# Patient Record
Sex: Male | Born: 1940 | Race: White | Hispanic: No | Marital: Married
Health system: Southern US, Community
[De-identification: ages and names within clinical notes are randomized; demographics above are authoritative.]

---

## 2021-01-18 ENCOUNTER — Inpatient Hospital Stay
Admission: AD | Admit: 2021-01-18 | Discharge: 2021-02-09 | Disposition: A | Discharge status: Still In Hospital | Payer: No Typology Code available for payment source | Source: Other Acute Inpatient Hospital | Attending: Internal Medicine | Admitting: Internal Medicine

## 2021-01-18 ENCOUNTER — Other Ambulatory Visit (HOSPITAL_COMMUNITY): Payer: Self-pay

## 2021-01-18 DIAGNOSIS — Z9911 Dependence on respirator [ventilator] status: Secondary | ICD-10-CM

## 2021-01-18 DIAGNOSIS — J939 Pneumothorax, unspecified: Secondary | ICD-10-CM

## 2021-01-18 DIAGNOSIS — Z431 Encounter for attention to gastrostomy: Secondary | ICD-10-CM

## 2021-01-18 DIAGNOSIS — Z930 Tracheostomy status: Secondary | ICD-10-CM

## 2021-01-18 DIAGNOSIS — S12400A Unspecified displaced fracture of fifth cervical vertebra, initial encounter for closed fracture: Secondary | ICD-10-CM

## 2021-01-18 DIAGNOSIS — J9690 Respiratory failure, unspecified, unspecified whether with hypoxia or hypercapnia: Secondary | ICD-10-CM

## 2021-01-18 DIAGNOSIS — I63511 Cerebral infarction due to unspecified occlusion or stenosis of right middle cerebral artery: Secondary | ICD-10-CM

## 2021-01-18 DIAGNOSIS — J189 Pneumonia, unspecified organism: Secondary | ICD-10-CM

## 2021-01-18 DIAGNOSIS — J9621 Acute and chronic respiratory failure with hypoxia: Secondary | ICD-10-CM

## 2021-01-18 LAB — BLOOD GAS, ARTERIAL
Acid-Base Excess: 2.8 mmol/L — ABNORMAL HIGH (ref 0.0–2.0)
Bicarbonate: 26.2 mmol/L (ref 20.0–28.0)
FIO2: 45
O2 Saturation: 95.5 %
Patient temperature: 37
pCO2 arterial: 36.1 mmHg (ref 32.0–48.0)
pH, Arterial: 7.474 — ABNORMAL HIGH (ref 7.350–7.450)
pO2, Arterial: 73.1 mmHg — ABNORMAL LOW (ref 83.0–108.0)

## 2021-01-18 MED ORDER — DIATRIZOATE MEGLUMINE & SODIUM 66-10 % PO SOLN
ORAL | Status: AC
  Administered 2021-01-18: 30 mL via GASTROSTOMY
  Filled 2021-01-18: qty 30

## 2021-01-19 DIAGNOSIS — S12400A Unspecified displaced fracture of fifth cervical vertebra, initial encounter for closed fracture: Secondary | ICD-10-CM

## 2021-01-19 DIAGNOSIS — J189 Pneumonia, unspecified organism: Secondary | ICD-10-CM | POA: Diagnosis not present

## 2021-01-19 DIAGNOSIS — S12401S Unspecified nondisplaced fracture of fifth cervical vertebra, sequela: Secondary | ICD-10-CM | POA: Diagnosis not present

## 2021-01-19 DIAGNOSIS — J9621 Acute and chronic respiratory failure with hypoxia: Secondary | ICD-10-CM

## 2021-01-19 DIAGNOSIS — J939 Pneumothorax, unspecified: Secondary | ICD-10-CM

## 2021-01-19 DIAGNOSIS — I63511 Cerebral infarction due to unspecified occlusion or stenosis of right middle cerebral artery: Secondary | ICD-10-CM

## 2021-01-19 LAB — CBC WITH DIFFERENTIAL/PLATELET
Abs Immature Granulocytes: 0.48 10*3/uL — ABNORMAL HIGH (ref 0.00–0.07)
Basophils Absolute: 0 10*3/uL (ref 0.0–0.1)
Basophils Relative: 1 %
Eosinophils Absolute: 0.2 10*3/uL (ref 0.0–0.5)
Eosinophils Relative: 3 %
HCT: 24.9 % — ABNORMAL LOW (ref 39.0–52.0)
Hemoglobin: 8 g/dL — ABNORMAL LOW (ref 13.0–17.0)
Immature Granulocytes: 6 %
Lymphocytes Relative: 5 %
Lymphs Abs: 0.4 10*3/uL — ABNORMAL LOW (ref 0.7–4.0)
MCH: 30.1 pg (ref 26.0–34.0)
MCHC: 32.1 g/dL (ref 30.0–36.0)
MCV: 93.6 fL (ref 80.0–100.0)
Monocytes Absolute: 0.9 10*3/uL (ref 0.1–1.0)
Monocytes Relative: 11 %
Neutro Abs: 6.4 10*3/uL (ref 1.7–7.7)
Neutrophils Relative %: 74 %
Platelets: 411 10*3/uL — ABNORMAL HIGH (ref 150–400)
RBC: 2.66 MIL/uL — ABNORMAL LOW (ref 4.22–5.81)
RDW: 15.8 % — ABNORMAL HIGH (ref 11.5–15.5)
WBC: 8.4 10*3/uL (ref 4.0–10.5)
nRBC: 0 % (ref 0.0–0.2)

## 2021-01-19 LAB — COMPREHENSIVE METABOLIC PANEL
ALT: 52 U/L — ABNORMAL HIGH (ref 0–44)
AST: 44 U/L — ABNORMAL HIGH (ref 15–41)
Albumin: 1.7 g/dL — ABNORMAL LOW (ref 3.5–5.0)
Alkaline Phosphatase: 71 U/L (ref 38–126)
Anion gap: 7 (ref 5–15)
BUN: 27 mg/dL — ABNORMAL HIGH (ref 8–23)
CO2: 25 mmol/L (ref 22–32)
Calcium: 8.2 mg/dL — ABNORMAL LOW (ref 8.9–10.3)
Chloride: 101 mmol/L (ref 98–111)
Creatinine, Ser: 0.73 mg/dL (ref 0.61–1.24)
GFR, Estimated: >60 mL/min (ref >60)
Glucose, Bld: 116 mg/dL — ABNORMAL HIGH (ref 70–99)
Potassium: 4.4 mmol/L (ref 3.5–5.1)
Sodium: 133 mmol/L — ABNORMAL LOW (ref 135–145)
Total Bilirubin: 0.7 mg/dL (ref 0.3–1.2)
Total Protein: 5.4 g/dL — ABNORMAL LOW (ref 6.5–8.1)

## 2021-01-19 LAB — PHOSPHORUS: Phosphorus: 3.3 mg/dL (ref 2.5–4.6)

## 2021-01-19 LAB — PROTIME-INR
INR: 1.7 — ABNORMAL HIGH (ref 0.8–1.2)
Prothrombin Time: 19.9 seconds — ABNORMAL HIGH (ref 11.4–15.2)

## 2021-01-19 LAB — HEMOGLOBIN A1C
Hgb A1c MFr Bld: 5.9 % — ABNORMAL HIGH (ref 4.8–5.6)
Mean Plasma Glucose: 122.63 mg/dL

## 2021-01-19 LAB — TSH: TSH: 9.572 u[IU]/mL — ABNORMAL HIGH (ref 0.350–4.500)

## 2021-01-19 LAB — MAGNESIUM: Magnesium: 2.2 mg/dL (ref 1.7–2.4)
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
## 2021-01-20 DIAGNOSIS — J9621 Acute and chronic respiratory failure with hypoxia: Secondary | ICD-10-CM | POA: Diagnosis not present

## 2021-01-20 DIAGNOSIS — I63511 Cerebral infarction due to unspecified occlusion or stenosis of right middle cerebral artery: Secondary | ICD-10-CM | POA: Diagnosis not present

## 2021-01-20 DIAGNOSIS — S12401S Unspecified nondisplaced fracture of fifth cervical vertebra, sequela: Secondary | ICD-10-CM | POA: Diagnosis not present

## 2021-01-20 DIAGNOSIS — J189 Pneumonia, unspecified organism: Secondary | ICD-10-CM | POA: Diagnosis not present

## 2021-01-20 LAB — URINALYSIS, ROUTINE W REFLEX MICROSCOPIC
Bilirubin Urine: NEGATIVE
Glucose, UA: >=500 mg/dL — AB
Hgb urine dipstick: NEGATIVE
Ketones, ur: NEGATIVE mg/dL
Leukocytes,Ua: NEGATIVE
Nitrite: NEGATIVE
Protein, ur: 30 mg/dL — AB
Specific Gravity, Urine: 1.02 (ref 1.005–1.030)
pH: 5 (ref 5.0–8.0)

## 2021-01-20 LAB — CBC
HCT: 25 % — ABNORMAL LOW (ref 39.0–52.0)
Hemoglobin: 8.1 g/dL — ABNORMAL LOW (ref 13.0–17.0)
MCH: 30 pg (ref 26.0–34.0)
MCHC: 32.4 g/dL (ref 30.0–36.0)
MCV: 92.6 fL (ref 80.0–100.0)
Platelets: 463 10*3/uL — ABNORMAL HIGH (ref 150–400)
RBC: 2.7 MIL/uL — ABNORMAL LOW (ref 4.22–5.81)
RDW: 15.9 % — ABNORMAL HIGH (ref 11.5–15.5)
WBC: 12 10*3/uL — ABNORMAL HIGH (ref 4.0–10.5)
nRBC: 0 % (ref 0.0–0.2)

## 2021-01-20 LAB — BASIC METABOLIC PANEL
Anion gap: 6 (ref 5–15)
BUN: 30 mg/dL — ABNORMAL HIGH (ref 8–23)
CO2: 26 mmol/L (ref 22–32)
Calcium: 8.4 mg/dL — ABNORMAL LOW (ref 8.9–10.3)
Chloride: 103 mmol/L (ref 98–111)
Creatinine, Ser: 0.69 mg/dL (ref 0.61–1.24)
GFR, Estimated: >60 mL/min (ref >60)
Glucose, Bld: 218 mg/dL — ABNORMAL HIGH (ref 70–99)
Potassium: 4.2 mmol/L (ref 3.5–5.1)
Sodium: 135 mmol/L (ref 135–145)

## 2021-01-20 LAB — CULTURE, RESPIRATORY W GRAM STAIN

## 2021-01-20 LAB — T4, FREE: Free T4: 1.19 ng/dL — ABNORMAL HIGH (ref 0.61–1.12)

## 2021-01-20 LAB — MAGNESIUM: Magnesium: 2.1 mg/dL (ref 1.7–2.4)

## 2021-01-20 LAB — PHOSPHORUS: Phosphorus: 2.2 mg/dL — ABNORMAL LOW (ref 2.5–4.6)

## 2021-01-20 NOTE — Progress Notes (Signed)
Pulmonary Critical Care Medicine [facility] [place]   PULMONARY CRITICAL CARE SERVICE  PROGRESS NOTE     Rhyan Radler  [id number]:[id number]  DOB: [birth year]   DOA: 01/18/2021  Referring Physician: Carron Curie, MD  HPI: Jessie Schrieber is a 80 y.o. male being followed for ventilator/airway/oxygen weaning Acute on Chronic Respiratory Failure.  Patient is on pressure support has been on 35% FiO2 current pressure is 12/5  Medications: Reviewed on Rounds  Physical Exam:  Vitals: Temperature 96.7 pulse 101 respiratory rate is 17 blood pressure is 123/77 saturations 93%  Ventilator Settings on pressure support FiO2 35% pressure 12/5  General: Comfortable at this time Neck: supple Cardiovascular: no malignant arrhythmias Respiratory: Scattered rhonchi expansion is equal Skin: no rash seen on limited exam Musculoskeletal: No gross abnormality Psychiatric:unable to assess Neurologic:no involuntary movements         Lab Data:   Basic Metabolic Panel: Recent Labs  Lab 01/19/21 0358 01/20/21 0715  NA 133* 135  K 4.4 4.2  CL 101 103  CO2 25 26  GLUCOSE 116* 218*  BUN 27* 30*  CREATININE 0.73 0.69  CALCIUM 8.2* 8.4*  MG 2.2 2.1  PHOS 3.3 2.2*    ABG: Recent Labs  Lab 01/18/21 1350  PHART 7.474*  PCO2ART 36.1  PO2ART 73.1*  HCO3 26.2  O2SAT 95.5    Liver Function Tests: Recent Labs  Lab 01/19/21 0358  AST 44*  ALT 52*  ALKPHOS 71  BILITOT 0.7  PROT 5.4*  ALBUMIN 1.7*   No results for input(s): LIPASE, AMYLASE in the last 168 hours. No results for input(s): AMMONIA in the last 168 hours.  CBC: Recent Labs  Lab 01/19/21 0358 01/20/21 0715  WBC 8.4 12.0*  NEUTROABS 6.4  --   HGB 8.0* 8.1*  HCT 24.9* 25.0*  MCV 93.6 92.6  PLT 411* 463*    Cardiac Enzymes: No results for input(s): CKTOTAL, CKMB, CKMBINDEX, TROPONINI in the last 168 hours.  BNP (last 3 results) No results for input(s): BNP in the last 8760 hours.  ProBNP (last 3  results) No results for input(s): PROBNP in the last 8760 hours.  Radiological Exams: DG ABDOMEN PEG TUBE LOCATION  Result Date: 01/18/2021 CLINICAL DATA:  Check gastrostomy catheter placement EXAM: ABDOMEN - 1 VIEW COMPARISON:  None. FINDINGS: Gastrostomy catheter is noted injected with contrast. Contrast flows directly into the gastric lumen. No obstructive changes are seen. IMPRESSION: Gastrostomy catheter within the stomach. Electronically Signed   By: Alcide Clever M.D.   On: 01/18/2021 12:24   DG Chest Port 1 View  Result Date: 01/18/2021 CLINICAL DATA:  Check central line placement EXAM: PORTABLE CHEST 1 VIEW COMPARISON:  None. FINDINGS: Cardiac shadow is within normal limits. Endotracheal tube is noted in satisfactory position. Left-sided PICC line is noted in the mid to distal SVC. Right carotid stent is seen. Lungs are well aerated bilaterally with patchy airspace opacity worse in the left retrocardiac region consistent with multifocal pneumonia. IMPRESSION: Patchy multifocal pneumonia worst in the left retrocardiac region. Tubes and lines in satisfactory position. Electronically Signed   By: Alcide Clever M.D.   On: 01/18/2021 12:23    Assessment/Plan Active Problems:   Acute on chronic respiratory failure with hypoxia (HCC)   Acute ischemic right MCA stroke (HCC)   C5 cervical fracture (HCC)   Pneumothorax   Healthcare-associated pneumonia   Acute on chronic respiratory failure hypoxia patient remains orally intubated doing well with the weaning.  We will going to see how  he does I expect he may end up needing to have a tracheostomy for anticipated prolonged mechanical ventilation but we will assess on a daily basis Acute stroke no change overall continue with supportive care C5 fracture patient is in a cervical collar Pneumothorax has been treated last chest x-ray shows multifocal disease no pneumothorax noted Healthcare associated pneumonia again multifocal disease as above has  been treated with antibiotic   I have personally seen and evaluated the patient, evaluated laboratory and imaging results, formulated the assessment and plan and placed orders. The Patient requires high complexity decision making with multiple systems involvement.  Rounds were done with the Respiratory Therapy Director and Staff therapists and discussed with nursing staff also.  Yevonne Pax, MD [facility] Pulmonary Critical Care Medicine Sleep Medicine

## 2021-01-21 DIAGNOSIS — S12401S Unspecified nondisplaced fracture of fifth cervical vertebra, sequela: Secondary | ICD-10-CM | POA: Diagnosis not present

## 2021-01-21 DIAGNOSIS — J189 Pneumonia, unspecified organism: Secondary | ICD-10-CM | POA: Diagnosis not present

## 2021-01-21 DIAGNOSIS — I63511 Cerebral infarction due to unspecified occlusion or stenosis of right middle cerebral artery: Secondary | ICD-10-CM | POA: Diagnosis not present

## 2021-01-21 DIAGNOSIS — J9621 Acute and chronic respiratory failure with hypoxia: Secondary | ICD-10-CM | POA: Diagnosis not present

## 2021-01-21 LAB — BASIC METABOLIC PANEL
Anion gap: 5 (ref 5–15)
BUN: 49 mg/dL — ABNORMAL HIGH (ref 8–23)
CO2: 28 mmol/L (ref 22–32)
Calcium: 8.5 mg/dL — ABNORMAL LOW (ref 8.9–10.3)
Chloride: 103 mmol/L (ref 98–111)
Creatinine, Ser: 0.68 mg/dL (ref 0.61–1.24)
GFR, Estimated: >60 mL/min (ref >60)
Glucose, Bld: 284 mg/dL — ABNORMAL HIGH (ref 70–99)
Potassium: 4.9 mmol/L (ref 3.5–5.1)
Sodium: 136 mmol/L (ref 135–145)

## 2021-01-21 LAB — CBC
HCT: 24 % — ABNORMAL LOW (ref 39.0–52.0)
Hemoglobin: 7.7 g/dL — ABNORMAL LOW (ref 13.0–17.0)
MCH: 30.1 pg (ref 26.0–34.0)
MCHC: 32.1 g/dL (ref 30.0–36.0)
MCV: 93.8 fL (ref 80.0–100.0)
Platelets: 532 10*3/uL — ABNORMAL HIGH (ref 150–400)
RBC: 2.56 MIL/uL — ABNORMAL LOW (ref 4.22–5.81)
RDW: 16 % — ABNORMAL HIGH (ref 11.5–15.5)
WBC: 19.1 10*3/uL — ABNORMAL HIGH (ref 4.0–10.5)
nRBC: 0 % (ref 0.0–0.2)

## 2021-01-21 LAB — URINE CULTURE: Culture: NO GROWTH

## 2021-01-21 LAB — PHOSPHORUS: Phosphorus: 1.9 mg/dL — ABNORMAL LOW (ref 2.5–4.6)

## 2021-01-21 LAB — MAGNESIUM: Magnesium: 2.2 mg/dL (ref 1.7–2.4)

## 2021-01-21 NOTE — Progress Notes (Signed)
Pulmonary Critical Care Medicine [facility] [place]   PULMONARY CRITICAL CARE SERVICE  PROGRESS NOTE     Julian Johnson  [id number]  DOB: August 21, 1940   DOA: 01/18/2021  Referring Physician: Carron Curie, MD  HPI: Julian Johnson is a 80 y.o. male being followed for ventilator/airway/oxygen weaning Acute on Chronic Respiratory Failure.  Patient currently is on pressure support has been on 35% FiO2 with a pressure of 12/5  Medications: Reviewed on Rounds  Physical Exam:  Vitals: Temperature is 97.1 pulse 71 respiratory rate is 30 blood pressure 126/64 saturations 100%  Ventilator Settings on pressure support FiO2 35% pressure 12/5  General: Comfortable at this time Neck: supple Cardiovascular: no malignant arrhythmias Respiratory: No rhonchi no rales noted at this time Skin: no rash seen on limited exam Musculoskeletal: No gross abnormality Psychiatric:unable to assess Neurologic:no involuntary movements         Lab Data:   Basic Metabolic Panel: Recent Labs  Lab 01/19/21 0358 01/20/21 0715 01/21/21 0458  NA 133* 135 136  K 4.4 4.2 4.9  CL 101 103 103  CO2 25 26 28   GLUCOSE 116* 218* 284*  BUN 27* 30* 49*  CREATININE 0.73 0.69 0.68  CALCIUM 8.2* 8.4* 8.5*  MG 2.2 2.1 2.2  PHOS 3.3 2.2* 1.9*    ABG: Recent Labs  Lab 01/18/21 1350  PHART 7.474*  PCO2ART 36.1  PO2ART 73.1*  HCO3 26.2  O2SAT 95.5    Liver Function Tests: Recent Labs  Lab 01/19/21 0358  AST 44*  ALT 52*  ALKPHOS 71  BILITOT 0.7  PROT 5.4*  ALBUMIN 1.7*   No results for input(s): LIPASE, AMYLASE in the last 168 hours. No results for input(s): AMMONIA in the last 168 hours.  CBC: Recent Labs  Lab 01/19/21 0358 01/20/21 0715 01/21/21 0458  WBC 8.4 12.0* 19.1*  NEUTROABS 6.4  --   --   HGB 8.0* 8.1* 7.7*  HCT 24.9* 25.0* 24.0*  MCV 93.6 92.6 93.8  PLT 411* 463* 532*    Cardiac Enzymes: No results for input(s): CKTOTAL, CKMB, CKMBINDEX, TROPONINI in the last  168 hours.  BNP (last 3 results) No results for input(s): BNP in the last 8760 hours.  ProBNP (last 3 results) No results for input(s): PROBNP in the last 8760 hours.  Radiological Exams: No results found.  Assessment/Plan Active Problems:   Acute on chronic respiratory failure with hypoxia (HCC)   Acute ischemic right MCA stroke (HCC)   C5 cervical fracture (HCC)   Pneumothorax   Healthcare-associated pneumonia   Acute on chronic respiratory failure hypoxia plan is going to be to continue with the pressure support the goal is for 8 hours plan is to continue aggressive pulmonary toilet also Acute stroke no change supportive care therapy as tolerated C5 cervical fracture patient remains in a neck collar Healthcare associated pneumonia treated we will continue to monitor closely Pneumothorax no change   I have personally seen and evaluated the patient, evaluated laboratory and imaging results, formulated the assessment and plan and placed orders. The Patient requires high complexity decision making with multiple systems involvement.  Rounds were done with the Respiratory Therapy Director and Staff therapists and discussed with nursing staff also.  01/23/21, MD [facility] Pulmonary Critical Care Medicine Sleep Medicine

## 2021-01-22 ENCOUNTER — Other Ambulatory Visit (HOSPITAL_COMMUNITY): Payer: Self-pay

## 2021-01-22 LAB — BASIC METABOLIC PANEL
Anion gap: 6 (ref 5–15)
BUN: 60 mg/dL — ABNORMAL HIGH (ref 8–23)
CO2: 26 mmol/L (ref 22–32)
Calcium: 8.4 mg/dL — ABNORMAL LOW (ref 8.9–10.3)
Chloride: 106 mmol/L (ref 98–111)
Creatinine, Ser: 0.65 mg/dL (ref 0.61–1.24)
GFR, Estimated: >60 mL/min (ref >60)
Glucose, Bld: 224 mg/dL — ABNORMAL HIGH (ref 70–99)
Potassium: 4.8 mmol/L (ref 3.5–5.1)
Sodium: 138 mmol/L (ref 135–145)

## 2021-01-22 LAB — CBC
HCT: 21.6 % — ABNORMAL LOW (ref 39.0–52.0)
Hemoglobin: 6.9 g/dL — CL (ref 13.0–17.0)
MCH: 29.9 pg (ref 26.0–34.0)
MCHC: 31.9 g/dL (ref 30.0–36.0)
MCV: 93.5 fL (ref 80.0–100.0)
Platelets: 461 10*3/uL — ABNORMAL HIGH (ref 150–400)
RBC: 2.31 MIL/uL — ABNORMAL LOW (ref 4.22–5.81)
RDW: 16.1 % — ABNORMAL HIGH (ref 11.5–15.5)
WBC: 20.2 10*3/uL — ABNORMAL HIGH (ref 4.0–10.5)
nRBC: 0 % (ref 0.0–0.2)

## 2021-01-22 LAB — ABO/RH: ABO/RH(D): O NEG

## 2021-01-22 LAB — EXPECTORATED SPUTUM ASSESSMENT W GRAM STAIN, RFLX TO RESP C

## 2021-01-22 LAB — URINALYSIS, ROUTINE W REFLEX MICROSCOPIC
Bilirubin Urine: NEGATIVE
Glucose, UA: 50 mg/dL — AB
Hgb urine dipstick: NEGATIVE
Ketones, ur: 5 mg/dL — AB
Nitrite: NEGATIVE
Protein, ur: 30 mg/dL — AB
Specific Gravity, Urine: 1.023 (ref 1.005–1.030)
pH: 5 (ref 5.0–8.0)

## 2021-01-22 LAB — PHOSPHORUS: Phosphorus: 2.2 mg/dL — ABNORMAL LOW (ref 2.5–4.6)

## 2021-01-22 LAB — MAGNESIUM: Magnesium: 2.1 mg/dL (ref 1.7–2.4)

## 2021-01-22 LAB — PREPARE RBC (CROSSMATCH)

## 2021-01-22 LAB — OCCULT BLOOD X 1 CARD TO LAB, STOOL: Fecal Occult Bld: POSITIVE — AB

## 2021-01-23 ENCOUNTER — Other Ambulatory Visit (HOSPITAL_COMMUNITY): Payer: Self-pay

## 2021-01-23 LAB — CBC
HCT: 21.4 % — ABNORMAL LOW (ref 39.0–52.0)
HCT: 26 % — ABNORMAL LOW (ref 39.0–52.0)
Hemoglobin: 7 g/dL — ABNORMAL LOW (ref 13.0–17.0)
Hemoglobin: 8.6 g/dL — ABNORMAL LOW (ref 13.0–17.0)
MCH: 31 pg (ref 26.0–34.0)
MCH: 31.2 pg (ref 26.0–34.0)
MCHC: 32.7 g/dL (ref 30.0–36.0)
MCHC: 33.1 g/dL (ref 30.0–36.0)
MCV: 94.7 fL (ref 80.0–100.0)
MCV: 94.2 fL (ref 80.0–100.0)
Platelets: 432 10*3/uL — ABNORMAL HIGH (ref 150–400)
Platelets: 486 10*3/uL — ABNORMAL HIGH (ref 150–400)
RBC: 2.26 MIL/uL — ABNORMAL LOW (ref 4.22–5.81)
RBC: 2.76 MIL/uL — ABNORMAL LOW (ref 4.22–5.81)
RDW: 15.9 % — ABNORMAL HIGH (ref 11.5–15.5)
RDW: 16 % — ABNORMAL HIGH (ref 11.5–15.5)
WBC: 17.1 10*3/uL — ABNORMAL HIGH (ref 4.0–10.5)
WBC: 19.4 10*3/uL — ABNORMAL HIGH (ref 4.0–10.5)
nRBC: 0 % (ref 0.0–0.2)
nRBC: 0 % (ref 0.0–0.2)

## 2021-01-23 LAB — MAGNESIUM: Magnesium: 2.1 mg/dL (ref 1.7–2.4)

## 2021-01-23 LAB — BASIC METABOLIC PANEL
Anion gap: 6 (ref 5–15)
BUN: 57 mg/dL — ABNORMAL HIGH (ref 8–23)
CO2: 26 mmol/L (ref 22–32)
Calcium: 8.3 mg/dL — ABNORMAL LOW (ref 8.9–10.3)
Chloride: 107 mmol/L (ref 98–111)
Creatinine, Ser: 0.67 mg/dL (ref 0.61–1.24)
GFR, Estimated: >60 mL/min (ref >60)
Glucose, Bld: 207 mg/dL — ABNORMAL HIGH (ref 70–99)
Potassium: 4.5 mmol/L (ref 3.5–5.1)
Sodium: 139 mmol/L (ref 135–145)

## 2021-01-23 LAB — URINE CULTURE: Culture: NO GROWTH

## 2021-01-23 LAB — PHOSPHORUS: Phosphorus: 2.2 mg/dL — ABNORMAL LOW (ref 2.5–4.6)

## 2021-01-24 DIAGNOSIS — J189 Pneumonia, unspecified organism: Secondary | ICD-10-CM | POA: Diagnosis not present

## 2021-01-24 DIAGNOSIS — J9621 Acute and chronic respiratory failure with hypoxia: Secondary | ICD-10-CM | POA: Diagnosis not present

## 2021-01-24 DIAGNOSIS — I63511 Cerebral infarction due to unspecified occlusion or stenosis of right middle cerebral artery: Secondary | ICD-10-CM | POA: Diagnosis not present

## 2021-01-24 DIAGNOSIS — J9620 Acute and chronic respiratory failure, unspecified whether with hypoxia or hypercapnia: Secondary | ICD-10-CM | POA: Diagnosis not present

## 2021-01-24 DIAGNOSIS — S12401S Unspecified nondisplaced fracture of fifth cervical vertebra, sequela: Secondary | ICD-10-CM | POA: Diagnosis not present

## 2021-01-24 LAB — CBC
HCT: 21.8 % — ABNORMAL LOW (ref 39.0–52.0)
Hemoglobin: 6.8 g/dL — CL (ref 13.0–17.0)
MCH: 30.2 pg (ref 26.0–34.0)
MCHC: 31.2 g/dL (ref 30.0–36.0)
MCV: 96.9 fL (ref 80.0–100.0)
Platelets: 448 10*3/uL — ABNORMAL HIGH (ref 150–400)
RBC: 2.25 MIL/uL — ABNORMAL LOW (ref 4.22–5.81)
RDW: 16.3 % — ABNORMAL HIGH (ref 11.5–15.5)
WBC: 10.9 10*3/uL — ABNORMAL HIGH (ref 4.0–10.5)
nRBC: 0 % (ref 0.0–0.2)

## 2021-01-24 LAB — BASIC METABOLIC PANEL
Anion gap: 6 (ref 5–15)
BUN: 44 mg/dL — ABNORMAL HIGH (ref 8–23)
CO2: 26 mmol/L (ref 22–32)
Calcium: 8.1 mg/dL — ABNORMAL LOW (ref 8.9–10.3)
Chloride: 108 mmol/L (ref 98–111)
Creatinine, Ser: 0.61 mg/dL (ref 0.61–1.24)
GFR, Estimated: >60 mL/min (ref >60)
Glucose, Bld: 200 mg/dL — ABNORMAL HIGH (ref 70–99)
Potassium: 4.3 mmol/L (ref 3.5–5.1)
Sodium: 140 mmol/L (ref 135–145)

## 2021-01-24 LAB — PREPARE RBC (CROSSMATCH)

## 2021-01-24 LAB — HEMOGLOBIN A1C
Hgb A1c MFr Bld: 5.7 % — ABNORMAL HIGH (ref 4.8–5.6)
Mean Plasma Glucose: 117 mg/dL

## 2021-01-24 LAB — PHOSPHORUS: Phosphorus: 3 mg/dL (ref 2.5–4.6)

## 2021-01-24 LAB — MAGNESIUM: Magnesium: 2.2 mg/dL (ref 1.7–2.4)

## 2021-01-24 NOTE — Progress Notes (Signed)
Pulmonary Critical Care Medicine [facility] [place]   PULMONARY CRITICAL CARE SERVICE  PROGRESS NOTE     Julian Johnson  PRF:[id number]  DOB: 10-09-1940   DOA: 01/18/2021  Referring Physician: Carron Curie, MD  HPI: Julian Johnson is a 80 y.o. male being followed for ventilator/airway/oxygen weaning Acute on Chronic Respiratory Failure.  Patient is on pressure support has been on 40% FiO2 seems to be doing fine so far will be weaning  Medications: Reviewed on Rounds  Physical Exam:  Vitals: Temperature is 96.7 pulse 61 respiratory rate is 25 blood pressure is 148/69 saturations 100%  Ventilator Settings on pressure support FiO2 is 40% pressure 12/5  General: Comfortable at this time Neck: supple Cardiovascular: no malignant arrhythmias Respiratory: Scattered rhonchi expansion is equal at this time Skin: no rash seen on limited exam Musculoskeletal: No gross abnormality Psychiatric:unable to assess Neurologic:no involuntary movements         Lab Data:   Basic Metabolic Panel: Recent Labs  Lab 01/20/21 0715 01/21/21 0458 01/22/21 0646 01/23/21 0436 01/24/21 1213  NA 135 136 138 139 140  K 4.2 4.9 4.8 4.5 4.3  CL 103 103 106 107 108  CO2 26 28 26 26 26   GLUCOSE 218* 284* 224* 207* 200*  BUN 30* 49* 60* 57* 44*  CREATININE 0.69 0.68 0.65 0.67 0.61  CALCIUM 8.4* 8.5* 8.4* 8.3* 8.1*  MG 2.1 2.2 2.1 2.1 2.2  PHOS 2.2* 1.9* 2.2* 2.2* 3.0    ABG: Recent Labs  Lab 01/18/21 1350  PHART 7.474*  PCO2ART 36.1  PO2ART 73.1*  HCO3 26.2  O2SAT 95.5    Liver Function Tests: Recent Labs  Lab 01/19/21 0358  AST 44*  ALT 52*  ALKPHOS 71  BILITOT 0.7  PROT 5.4*  ALBUMIN 1.7*   No results for input(s): LIPASE, AMYLASE in the last 168 hours. No results for input(s): AMMONIA in the last 168 hours.  CBC: Recent Labs  Lab 01/19/21 0358 01/20/21 0715 01/21/21 0458 01/22/21 0646 01/23/21 0436 01/23/21 0759 01/24/21 1213  WBC 8.4   < > 19.1* 20.2*  17.1* 19.4* 10.9*  NEUTROABS 6.4  --   --   --   --   --   --   HGB 8.0*   < > 7.7* 6.9* 7.0* 8.6* 6.8*  HCT 24.9*   < > 24.0* 21.6* 21.4* 26.0* 21.8*  MCV 93.6   < > 93.8 93.5 94.7 94.2 96.9  PLT 411*   < > 532* 461* 432* 486* 448*   < > = values in this interval not displayed.    Cardiac Enzymes: No results for input(s): CKTOTAL, CKMB, CKMBINDEX, TROPONINI in the last 168 hours.  BNP (last 3 results) No results for input(s): BNP in the last 8760 hours.  ProBNP (last 3 results) No results for input(s): PROBNP in the last 8760 hours.  Radiological Exams: DG CHEST PORT 1 VIEW  Result Date: 01/23/2021 CLINICAL DATA:  Respirator dependence. EXAM: PORTABLE CHEST 1 VIEW COMPARISON:  01/22/2021 FINDINGS: 0731 hours. Endotracheal tube tip is 6 cm above the base of the carina. Left base patchy airspace disease is stable in the interval as is the small left pleural effusion. Pulmonary nodule right mid lung is likely a granuloma given the conspicuity. Interstitial markings are diffusely coarsened with chronic features. IMPRESSION: 1. Endotracheal tube tip 6 cm above the base of the carina. 2. Stable left base airspace disease and small left pleural effusion. Electronically Signed   By: 01/24/2021.D.  On: 01/23/2021 08:11    Assessment/Plan Active Problems:   Acute on chronic respiratory failure with hypoxia (HCC)   Acute ischemic right MCA stroke (HCC)   C5 cervical fracture (HCC)   Pneumothorax   Healthcare-associated pneumonia   Acute on chronic respiratory failure hypoxia plan is going to be to continue to wean on pressure support.  I have requested for ENT to see the patient for consultation for possible Tracheostomy will wait on recommendations.  Patient is DNR Acute stroke has no change we will continue with supportive care. C5 cervical fracture supportive care right now patient does have cervical collar in place Healthcare associated pneumonia treated continue to  monitor Pneumothorax resolved no change   I have personally seen and evaluated the patient, evaluated laboratory and imaging results, formulated the assessment and plan and placed orders. The Patient requires high complexity decision making with multiple systems involvement.  Rounds were done with the Respiratory Therapy Director and Staff therapists and discussed with nursing staff also.  Yevonne Pax, MD [facility] Pulmonary Critical Care Medicine Sleep Medicine

## 2021-01-25 DIAGNOSIS — J189 Pneumonia, unspecified organism: Secondary | ICD-10-CM | POA: Diagnosis not present

## 2021-01-25 DIAGNOSIS — S12401S Unspecified nondisplaced fracture of fifth cervical vertebra, sequela: Secondary | ICD-10-CM | POA: Diagnosis not present

## 2021-01-25 DIAGNOSIS — J9621 Acute and chronic respiratory failure with hypoxia: Secondary | ICD-10-CM | POA: Diagnosis not present

## 2021-01-25 DIAGNOSIS — I63511 Cerebral infarction due to unspecified occlusion or stenosis of right middle cerebral artery: Secondary | ICD-10-CM | POA: Diagnosis not present

## 2021-01-25 LAB — CULTURE, RESPIRATORY W GRAM STAIN

## 2021-01-25 LAB — TYPE AND SCREEN
ABO/RH(D): O NEG
Antibody Screen: NEGATIVE
Unit division: 0
Unit division: 0

## 2021-01-25 LAB — VANCOMYCIN, TROUGH: Vancomycin Tr: 17 ug/mL (ref 15–20)

## 2021-01-25 LAB — BPAM RBC
Blood Product Expiration Date: 202207282359
Blood Product Expiration Date: 202208082359
ISSUE DATE / TIME: 202207231041
ISSUE DATE / TIME: 202207251543
Unit Type and Rh: 9500
Unit Type and Rh: 9500

## 2021-01-25 LAB — CBC
HCT: 26.4 % — ABNORMAL LOW (ref 39.0–52.0)
Hemoglobin: 8.5 g/dL — ABNORMAL LOW (ref 13.0–17.0)
MCH: 30.6 pg (ref 26.0–34.0)
MCHC: 32.2 g/dL (ref 30.0–36.0)
MCV: 95 fL (ref 80.0–100.0)
Platelets: 441 10*3/uL — ABNORMAL HIGH (ref 150–400)
RBC: 2.78 MIL/uL — ABNORMAL LOW (ref 4.22–5.81)
RDW: 17.6 % — ABNORMAL HIGH (ref 11.5–15.5)
WBC: 11.6 10*3/uL — ABNORMAL HIGH (ref 4.0–10.5)
nRBC: 0 % (ref 0.0–0.2)

## 2021-01-25 NOTE — Progress Notes (Signed)
Pulmonary Critical Care Medicine [facility] [place]   PULMONARY CRITICAL CARE SERVICE  PROGRESS NOTE     Julian Johnson  [id number]  DOB: [birth year]   DOA: 01/18/2021  Referring Physician: Carron Curie, MD  HPI: Wilferd Ritson is a 80 y.o. male being followed for ventilator/airway/oxygen weaning Acute on Chronic Respiratory Failure.  Patient currently is on pressure support on 28% FiO2 with a pressure of 12/5  Medications: Reviewed on Rounds  Physical Exam:  Vitals: Temperature is 98.7 pulse 59 respiratory is 14 blood pressure is 120/82 saturations 100%  Ventilator Settings pressure support FiO2 28% pressure 12/5  General: Comfortable at this time Neck: supple Cardiovascular: no malignant arrhythmias Respiratory: Scattered rhonchi coarse breath sounds Skin: no rash seen on limited exam Musculoskeletal: No gross abnormality Psychiatric:unable to assess Neurologic:no involuntary movements         Lab Data:   Basic Metabolic Panel: Recent Labs  Lab 01/20/21 0715 01/21/21 0458 01/22/21 0646 01/23/21 0436 01/24/21 1213  NA 135 136 138 139 140  K 4.2 4.9 4.8 4.5 4.3  CL 103 103 106 107 108  CO2 26 28 26 26 26   GLUCOSE 218* 284* 224* 207* 200*  BUN 30* 49* 60* 57* 44*  CREATININE 0.69 0.68 0.65 0.67 0.61  CALCIUM 8.4* 8.5* 8.4* 8.3* 8.1*  MG 2.1 2.2 2.1 2.1 2.2  PHOS 2.2* 1.9* 2.2* 2.2* 3.0    ABG: Recent Labs  Lab 01/18/21 1350  PHART 7.474*  PCO2ART 36.1  PO2ART 73.1*  HCO3 26.2  O2SAT 95.5    Liver Function Tests: Recent Labs  Lab 01/19/21 0358  AST 44*  ALT 52*  ALKPHOS 71  BILITOT 0.7  PROT 5.4*  ALBUMIN 1.7*   No results for input(s): LIPASE, AMYLASE in the last 168 hours. No results for input(s): AMMONIA in the last 168 hours.  CBC: Recent Labs  Lab 01/19/21 0358 01/20/21 0715 01/21/21 0458 01/22/21 0646 01/23/21 0436 01/23/21 0759 01/24/21 1213  WBC 8.4   < > 19.1* 20.2* 17.1* 19.4* 10.9*  NEUTROABS 6.4  --   --    --   --   --   --   HGB 8.0*   < > 7.7* 6.9* 7.0* 8.6* 6.8*  HCT 24.9*   < > 24.0* 21.6* 21.4* 26.0* 21.8*  MCV 93.6   < > 93.8 93.5 94.7 94.2 96.9  PLT 411*   < > 532* 461* 432* 486* 448*   < > = values in this interval not displayed.    Cardiac Enzymes: No results for input(s): CKTOTAL, CKMB, CKMBINDEX, TROPONINI in the last 168 hours.  BNP (last 3 results) No results for input(s): BNP in the last 8760 hours.  ProBNP (last 3 results) No results for input(s): PROBNP in the last 8760 hours.  Radiological Exams: No results found.  Assessment/Plan Active Problems:   Acute on chronic respiratory failure with hypoxia (HCC)   Acute ischemic right MCA stroke (HCC)   C5 cervical fracture (HCC)   Pneumothorax   Healthcare-associated pneumonia   Acute on chronic respiratory failure with hypoxia patient has been weaning okay on pressure support however the hemoglobin is 6.8 will need to be transfused respiratory therapy will reassess and also discussed with primary care team.  Will not clear yet about direction as far as the tracheostomy is concerned Pneumothorax treated resolved Healthcare associated pneumonia treated Acute stroke no change we will continue with supportive care prognosis guarded C5 cervical fracture patient is in the neck collar  I have personally seen and evaluated the patient, evaluated laboratory and imaging results, formulated the assessment and plan and placed orders. The Patient requires high complexity decision making with multiple systems involvement.  Rounds were done with the Respiratory Therapy Director and Staff therapists and discussed with nursing staff also.  Allyne Gee, MD [facility] Pulmonary Critical Care Medicine Sleep Medicine

## 2021-01-26 DIAGNOSIS — I63511 Cerebral infarction due to unspecified occlusion or stenosis of right middle cerebral artery: Secondary | ICD-10-CM | POA: Diagnosis not present

## 2021-01-26 DIAGNOSIS — J189 Pneumonia, unspecified organism: Secondary | ICD-10-CM | POA: Diagnosis not present

## 2021-01-26 DIAGNOSIS — S12401S Unspecified nondisplaced fracture of fifth cervical vertebra, sequela: Secondary | ICD-10-CM | POA: Diagnosis not present

## 2021-01-26 DIAGNOSIS — J9621 Acute and chronic respiratory failure with hypoxia: Secondary | ICD-10-CM | POA: Diagnosis not present

## 2021-01-26 NOTE — Progress Notes (Signed)
Pulmonary Critical Care Medicine [facility] [place]   PULMONARY CRITICAL CARE SERVICE  PROGRESS NOTE     Julian Johnson  FIE:[id number]  DOB: 13-Jun-1941   DOA: 01/18/2021  Referring Physician: Carron Curie, MD  HPI: Julian Johnson is a 80 y.o. male being followed for ventilator/airway/oxygen weaning Acute on Chronic Respiratory Failure.  Patient is on pressure support 12/5 is weaning still awaiting word on the tracheostomy  Medications: Reviewed on Rounds  Physical Exam:  Vitals: Temperature is 98.5 pulse 65 respiratory 27 blood pressure is 130/76 saturations 100%  Ventilator Settings pressure support FiO2 28% pressure 12/5  General: Comfortable at this time Neck: supple Cardiovascular: no malignant arrhythmias Respiratory: No rhonchi very coarse breath sounds Skin: no rash seen on limited exam Musculoskeletal: No gross abnormality Psychiatric:unable to assess Neurologic:no involuntary movements         Lab Data:   Basic Metabolic Panel: Recent Labs  Lab 01/20/21 0715 01/21/21 0458 01/22/21 0646 01/23/21 0436 01/24/21 1213  NA 135 136 138 139 140  K 4.2 4.9 4.8 4.5 4.3  CL 103 103 106 107 108  CO2 26 28 26 26 26   GLUCOSE 218* 284* 224* 207* 200*  BUN 30* 49* 60* 57* 44*  CREATININE 0.69 0.68 0.65 0.67 0.61  CALCIUM 8.4* 8.5* 8.4* 8.3* 8.1*  MG 2.1 2.2 2.1 2.1 2.2  PHOS 2.2* 1.9* 2.2* 2.2* 3.0    ABG: No results for input(s): PHART, PCO2ART, PO2ART, HCO3, O2SAT in the last 168 hours.  Liver Function Tests: No results for input(s): AST, ALT, ALKPHOS, BILITOT, PROT, ALBUMIN in the last 168 hours. No results for input(s): LIPASE, AMYLASE in the last 168 hours. No results for input(s): AMMONIA in the last 168 hours.  CBC: Recent Labs  Lab 01/22/21 0646 01/23/21 0436 01/23/21 0759 01/24/21 1213 01/25/21 1025  WBC 20.2* 17.1* 19.4* 10.9* 11.6*  HGB 6.9* 7.0* 8.6* 6.8* 8.5*  HCT 21.6* 21.4* 26.0* 21.8* 26.4*  MCV 93.5 94.7 94.2 96.9 95.0  PLT  461* 432* 486* 448* 441*    Cardiac Enzymes: No results for input(s): CKTOTAL, CKMB, CKMBINDEX, TROPONINI in the last 168 hours.  BNP (last 3 results) No results for input(s): BNP in the last 8760 hours.  ProBNP (last 3 results) No results for input(s): PROBNP in the last 8760 hours.  Radiological Exams: No results found.  Assessment/Plan Active Problems:   Acute on chronic respiratory failure with hypoxia (HCC)   Acute ischemic right MCA stroke (HCC)   C5 cervical fracture (HCC)   Pneumothorax   Healthcare-associated pneumonia   Acute on chronic respiratory failure hypoxia plan is to continue with pressure support for now.  Waiting tracheostomy to be done Acute stroke no change continue with present management supportive care C5 cervical fracture at baseline continue present therapy Pneumothorax no change Healthcare associated pneumonia treated we will continue with supportive care.   I have personally seen and evaluated the patient, evaluated laboratory and imaging results, formulated the assessment and plan and placed orders. The Patient requires high complexity decision making with multiple systems involvement.  Rounds were done with the Respiratory Therapy Director and Staff therapists and discussed with nursing staff also.  01/27/21, MD [facility] Pulmonary Critical Care Medicine Sleep Medicine

## 2021-01-27 DIAGNOSIS — J189 Pneumonia, unspecified organism: Secondary | ICD-10-CM | POA: Diagnosis not present

## 2021-01-27 DIAGNOSIS — J9621 Acute and chronic respiratory failure with hypoxia: Secondary | ICD-10-CM | POA: Diagnosis not present

## 2021-01-27 DIAGNOSIS — I63511 Cerebral infarction due to unspecified occlusion or stenosis of right middle cerebral artery: Secondary | ICD-10-CM | POA: Diagnosis not present

## 2021-01-27 DIAGNOSIS — Z9911 Dependence on respirator [ventilator] status: Secondary | ICD-10-CM

## 2021-01-27 DIAGNOSIS — S12401S Unspecified nondisplaced fracture of fifth cervical vertebra, sequela: Secondary | ICD-10-CM | POA: Diagnosis not present

## 2021-01-27 LAB — CULTURE, BLOOD (ROUTINE X 2)
Culture: NO GROWTH
Culture: NO GROWTH
Special Requests: ADEQUATE

## 2021-01-27 LAB — CBC
HCT: 25.7 % — ABNORMAL LOW (ref 39.0–52.0)
Hemoglobin: 8.1 g/dL — ABNORMAL LOW (ref 13.0–17.0)
MCH: 30.2 pg (ref 26.0–34.0)
MCHC: 31.5 g/dL (ref 30.0–36.0)
MCV: 95.9 fL (ref 80.0–100.0)
Platelets: 389 10*3/uL (ref 150–400)
RBC: 2.68 MIL/uL — ABNORMAL LOW (ref 4.22–5.81)
RDW: 17.3 % — ABNORMAL HIGH (ref 11.5–15.5)
WBC: 9.5 10*3/uL (ref 4.0–10.5)
nRBC: 0 % (ref 0.0–0.2)

## 2021-01-27 LAB — BASIC METABOLIC PANEL
Anion gap: 5 (ref 5–15)
BUN: 51 mg/dL — ABNORMAL HIGH (ref 8–23)
CO2: 26 mmol/L (ref 22–32)
Calcium: 8.1 mg/dL — ABNORMAL LOW (ref 8.9–10.3)
Chloride: 108 mmol/L (ref 98–111)
Creatinine, Ser: 0.65 mg/dL (ref 0.61–1.24)
GFR, Estimated: >60 mL/min (ref >60)
Glucose, Bld: 159 mg/dL — ABNORMAL HIGH (ref 70–99)
Potassium: 3.9 mmol/L (ref 3.5–5.1)
Sodium: 139 mmol/L (ref 135–145)

## 2021-01-27 LAB — MAGNESIUM: Magnesium: 2.1 mg/dL (ref 1.7–2.4)

## 2021-01-27 NOTE — Progress Notes (Signed)
Pulmonary Critical Care Medicine [facility] [place]   PULMONARY CRITICAL CARE SERVICE  PROGRESS NOTE     Julian Johnson  GUY:[id number]  DOB: 02/19/1941   DOA: 01/18/2021  Referring Physician: Carron Curie, MD  HPI: Julian Johnson is a 80 y.o. male being followed for ventilator/airway/oxygen weaning Acute on Chronic Respiratory Failure.  Remains on the ventilator intubated.  Has been doing well with the pressure support on 28% FiO2 good tidal volumes are noted  Medications: Reviewed on Rounds  Physical Exam:  Vitals: Temperature is 98.3 pulse 64 respiratory 26 blood pressure is 139/58 saturations 100%  Ventilator Settings on pressure support FiO2 is 28% pressure 12/5 tidal volume 383  General: Comfortable at this time Neck: supple Cardiovascular: no malignant arrhythmias Respiratory: No rhonchi very coarse breath sounds Skin: no rash seen on limited exam Musculoskeletal: No gross abnormality Psychiatric:unable to assess Neurologic:no involuntary movements         Lab Data:   Basic Metabolic Panel: Recent Labs  Lab 01/21/21 0458 01/22/21 0646 01/23/21 0436 01/24/21 1213 01/27/21 0400  NA 136 138 139 140 139  K 4.9 4.8 4.5 4.3 3.9  CL 103 106 107 108 108  CO2 28 26 26 26 26   GLUCOSE 284* 224* 207* 200* 159*  BUN 49* 60* 57* 44* 51*  CREATININE 0.68 0.65 0.67 0.61 0.65  CALCIUM 8.5* 8.4* 8.3* 8.1* 8.1*  MG 2.2 2.1 2.1 2.2 2.1  PHOS 1.9* 2.2* 2.2* 3.0  --     ABG: No results for input(s): PHART, PCO2ART, PO2ART, HCO3, O2SAT in the last 168 hours.  Liver Function Tests: No results for input(s): AST, ALT, ALKPHOS, BILITOT, PROT, ALBUMIN in the last 168 hours. No results for input(s): LIPASE, AMYLASE in the last 168 hours. No results for input(s): AMMONIA in the last 168 hours.  CBC: Recent Labs  Lab 01/23/21 0436 01/23/21 0759 01/24/21 1213 01/25/21 1025 01/27/21 0400  WBC 17.1* 19.4* 10.9* 11.6* 9.5  HGB 7.0* 8.6* 6.8* 8.5* 8.1*  HCT 21.4*  26.0* 21.8* 26.4* 25.7*  MCV 94.7 94.2 96.9 95.0 95.9  PLT 432* 486* 448* 441* 389    Cardiac Enzymes: No results for input(s): CKTOTAL, CKMB, CKMBINDEX, TROPONINI in the last 168 hours.  BNP (last 3 results) No results for input(s): BNP in the last 8760 hours.  ProBNP (last 3 results) No results for input(s): PROBNP in the last 8760 hours.  Radiological Exams: No results found.  Assessment/Plan Active Problems:   Acute on chronic respiratory failure with hypoxia (HCC)   Acute ischemic right MCA stroke (HCC)   C5 cervical fracture (HCC)   Pneumothorax   Healthcare-associated pneumonia   Acute on chronic respiratory failure hypoxia plan is to continue with the pressure support 12/5.  Awaiting word on possibility of tracheostomy.  I think Once the tracheostomy is done we should be able to wean him off the ventilator quickly unless the wife decides that she does not want to pursue for the tracheostomy Acute stroke no change continue with present management C5 fracture right now stable has been in cervical collar Pneumothorax resolved Healthcare associated pneumonia treated improving   I have personally seen and evaluated the patient, evaluated laboratory and imaging results, formulated the assessment and plan and placed orders. The Patient requires high complexity decision making with multiple systems involvement.  Rounds were done with the Respiratory Therapy Director and Staff therapists and discussed with nursing staff also.  14/5, MD [facility] Pulmonary Critical Care Medicine Sleep Medicine

## 2021-01-28 DIAGNOSIS — J189 Pneumonia, unspecified organism: Secondary | ICD-10-CM | POA: Diagnosis not present

## 2021-01-28 DIAGNOSIS — I63511 Cerebral infarction due to unspecified occlusion or stenosis of right middle cerebral artery: Secondary | ICD-10-CM | POA: Diagnosis not present

## 2021-01-28 DIAGNOSIS — J9621 Acute and chronic respiratory failure with hypoxia: Secondary | ICD-10-CM | POA: Diagnosis not present

## 2021-01-28 DIAGNOSIS — S12401S Unspecified nondisplaced fracture of fifth cervical vertebra, sequela: Secondary | ICD-10-CM | POA: Diagnosis not present

## 2021-01-28 NOTE — Progress Notes (Signed)
Pulmonary Critical Care Medicine [facility] [place]   PULMONARY CRITICAL CARE SERVICE  PROGRESS NOTE     Julian Johnson  VQM:[id number]  DOB: [birth year]   DOA: 01/18/2021  Referring Physician: Carron Curie, MD  HPI: Julian Johnson is a 80 y.o. male being followed for ventilator/airway/oxygen weaning Acute on Chronic Respiratory Failure.  At this time patient is on pressure support has been on a pressure of 12/5 looks good so far  Medications: Reviewed on Rounds  Physical Exam:  Vitals: Temperature is 97.5 pulse 67 respiratory rate is 23 blood pressure is 151/73 saturations 97%  Ventilator Settings off full support right now with the pressure support on pressure 12/5  General: Comfortable at this time Neck: supple Cardiovascular: no malignant arrhythmias Respiratory: No rhonchi very coarse breath sounds Skin: no rash seen on limited exam Musculoskeletal: No gross abnormality Psychiatric:unable to assess Neurologic:no involuntary movements         Lab Data:   Basic Metabolic Panel: Recent Labs  Lab 01/22/21 0646 01/23/21 0436 01/24/21 1213 01/27/21 0400  NA 138 139 140 139  K 4.8 4.5 4.3 3.9  CL 106 107 108 108  CO2 26 26 26 26   GLUCOSE 224* 207* 200* 159*  BUN 60* 57* 44* 51*  CREATININE 0.65 0.67 0.61 0.65  CALCIUM 8.4* 8.3* 8.1* 8.1*  MG 2.1 2.1 2.2 2.1  PHOS 2.2* 2.2* 3.0  --     ABG: No results for input(s): PHART, PCO2ART, PO2ART, HCO3, O2SAT in the last 168 hours.  Liver Function Tests: No results for input(s): AST, ALT, ALKPHOS, BILITOT, PROT, ALBUMIN in the last 168 hours. No results for input(s): LIPASE, AMYLASE in the last 168 hours. No results for input(s): AMMONIA in the last 168 hours.  CBC: Recent Labs  Lab 01/23/21 0436 01/23/21 0759 01/24/21 1213 01/25/21 1025 01/27/21 0400  WBC 17.1* 19.4* 10.9* 11.6* 9.5  HGB 7.0* 8.6* 6.8* 8.5* 8.1*  HCT 21.4* 26.0* 21.8* 26.4* 25.7*  MCV 94.7 94.2 96.9 95.0 95.9  PLT 432* 486* 448*  441* 389    Cardiac Enzymes: No results for input(s): CKTOTAL, CKMB, CKMBINDEX, TROPONINI in the last 168 hours.  BNP (last 3 results) No results for input(s): BNP in the last 8760 hours.  ProBNP (last 3 results) No results for input(s): PROBNP in the last 8760 hours.  Radiological Exams: No results found.  Assessment/Plan Active Problems:   Acute on chronic respiratory failure with hypoxia (HCC)   Acute ischemic right MCA stroke (HCC)   C5 cervical fracture (HCC)   Pneumothorax   Healthcare-associated pneumonia   Acute on chronic respiratory failure with hypoxia we will continue with pressure support titrate oxygen as tolerated we will continue pulmonary toilet secretion management. Acute stroke no change we will continue to monitor and follow along closely. C5 fracture no change we will continue with present management Pneumothorax at baseline supportive care Healthcare associated pneumonia treated   I have personally seen and evaluated the patient, evaluated laboratory and imaging results, formulated the assessment and plan and placed orders. The Patient requires high complexity decision making with multiple systems involvement.  Rounds were done with the Respiratory Therapy Director and Staff therapists and discussed with nursing staff also.  01/29/21, MD [facility] Pulmonary Critical Care Medicine Sleep Medicine

## 2021-01-30 DIAGNOSIS — J9621 Acute and chronic respiratory failure with hypoxia: Secondary | ICD-10-CM | POA: Diagnosis not present

## 2021-01-30 DIAGNOSIS — J189 Pneumonia, unspecified organism: Secondary | ICD-10-CM | POA: Diagnosis not present

## 2021-01-30 DIAGNOSIS — I63511 Cerebral infarction due to unspecified occlusion or stenosis of right middle cerebral artery: Secondary | ICD-10-CM | POA: Diagnosis not present

## 2021-01-30 DIAGNOSIS — S12401S Unspecified nondisplaced fracture of fifth cervical vertebra, sequela: Secondary | ICD-10-CM | POA: Diagnosis not present

## 2021-01-30 LAB — BASIC METABOLIC PANEL
Anion gap: 5 (ref 5–15)
BUN: 54 mg/dL — ABNORMAL HIGH (ref 8–23)
CO2: 25 mmol/L (ref 22–32)
Calcium: 8.1 mg/dL — ABNORMAL LOW (ref 8.9–10.3)
Chloride: 107 mmol/L (ref 98–111)
Creatinine, Ser: 0.69 mg/dL (ref 0.61–1.24)
GFR, Estimated: >60 mL/min (ref >60)
Glucose, Bld: 170 mg/dL — ABNORMAL HIGH (ref 70–99)
Potassium: 4.1 mmol/L (ref 3.5–5.1)
Sodium: 137 mmol/L (ref 135–145)

## 2021-01-30 LAB — CBC
HCT: 27.8 % — ABNORMAL LOW (ref 39.0–52.0)
Hemoglobin: 8.6 g/dL — ABNORMAL LOW (ref 13.0–17.0)
MCH: 29.9 pg (ref 26.0–34.0)
MCHC: 30.9 g/dL (ref 30.0–36.0)
MCV: 96.5 fL (ref 80.0–100.0)
Platelets: 354 10*3/uL (ref 150–400)
RBC: 2.88 MIL/uL — ABNORMAL LOW (ref 4.22–5.81)
RDW: 16.6 % — ABNORMAL HIGH (ref 11.5–15.5)
WBC: 9.4 10*3/uL (ref 4.0–10.5)
nRBC: 0 % (ref 0.0–0.2)

## 2021-01-30 LAB — MAGNESIUM: Magnesium: 2.2 mg/dL (ref 1.7–2.4)

## 2021-01-30 NOTE — Progress Notes (Signed)
Pulmonary Critical Care Medicine [facility] [place]   PULMONARY CRITICAL CARE SERVICE  PROGRESS NOTE     Julian Johnson  UYQ:[id number]  DOB: 06/13/1941   DOA: 01/18/2021  Referring Physician: Carron Curie, MD  HPI: Julian Johnson is a 80 y.o. male being followed for ventilator/airway/oxygen weaning Acute on Chronic Respiratory Failure.  Patient is currently on pressure support has been on a pressure of 12/5 neck collar remains in place at this time.  I spoke with the ENT and in order for [place] to do the tracheostomy patient needs to be temporarily a full code.  The patient's DNR status can be rescinded patient can have the procedure done and then will come back to our facility and will continue to manage her the weaning from that point on  Medications: Reviewed on Rounds  Physical Exam:  Vitals: Temperature is 98.0 pulse 84 respiratory 22 blood pressure is 148/68 saturations 92%  Ventilator Settings on pressure support FiO2 28% pressure 12/5  General: Comfortable at this time Neck: supple Cardiovascular: no malignant arrhythmias Respiratory: No rhonchi very coarse breath sounds are noted. Skin: no rash seen on limited exam Musculoskeletal: No gross abnormality Psychiatric:unable to assess Neurologic:no involuntary movements         Lab Data:   Basic Metabolic Panel: Recent Labs  Lab 01/24/21 1213 01/27/21 0400 01/30/21 0344  NA 140 139 137  K 4.3 3.9 4.1  CL 108 108 107  CO2 26 26 25   GLUCOSE 200* 159* 170*  BUN 44* 51* 54*  CREATININE 0.61 0.65 0.69  CALCIUM 8.1* 8.1* 8.1*  MG 2.2 2.1 2.2  PHOS 3.0  --   --     ABG: No results for input(s): PHART, PCO2ART, PO2ART, HCO3, O2SAT in the last 168 hours.  Liver Function Tests: No results for input(s): AST, ALT, ALKPHOS, BILITOT, PROT, ALBUMIN in the last 168 hours. No results for input(s): LIPASE, AMYLASE in the last 168 hours. No results for input(s): AMMONIA in the last 168 hours.  CBC: Recent Labs   Lab 01/24/21 1213 01/25/21 1025 01/27/21 0400 01/30/21 0344  WBC 10.9* 11.6* 9.5 9.4  HGB 6.8* 8.5* 8.1* 8.6*  HCT 21.8* 26.4* 25.7* 27.8*  MCV 96.9 95.0 95.9 96.5  PLT 448* 441* 389 354    Cardiac Enzymes: No results for input(s): CKTOTAL, CKMB, CKMBINDEX, TROPONINI in the last 168 hours.  BNP (last 3 results) No results for input(s): BNP in the last 8760 hours.  ProBNP (last 3 results) No results for input(s): PROBNP in the last 8760 hours.  Radiological Exams: No results found.  Assessment/Plan Active Problems:   Acute on chronic respiratory failure with hypoxia (HCC)   Acute ischemic right MCA stroke (HCC)   C5 cervical fracture (HCC)   Pneumothorax   Healthcare-associated pneumonia   Acute on chronic respiratory failure with hypoxia plan is going to be to continue with pressure support for now.  If we can have a conversation with the patient's wife regarding the CODE STATUS then patient can go ahead and have the tracheostomy done. Acute stroke overall no change we will continue to monitor closely. C5 cervical fracture no change we will continue with supportive care. Pneumothorax treated resolved Healthcare associated pneumonia has been treated with antibiotics we will continue to monitor closely   I have personally seen and evaluated the patient, evaluated laboratory and imaging results, formulated the assessment and plan and placed orders. The Patient requires high complexity decision making with multiple systems involvement.  Rounds were  done with the Respiratory Therapy Director and Staff therapists and discussed with nursing staff also.  Allyne Gee, MD [facility] Pulmonary Critical Care Medicine Sleep Medicine

## 2021-01-31 DIAGNOSIS — J9621 Acute and chronic respiratory failure with hypoxia: Secondary | ICD-10-CM | POA: Diagnosis not present

## 2021-01-31 DIAGNOSIS — I63511 Cerebral infarction due to unspecified occlusion or stenosis of right middle cerebral artery: Secondary | ICD-10-CM | POA: Diagnosis not present

## 2021-01-31 DIAGNOSIS — J189 Pneumonia, unspecified organism: Secondary | ICD-10-CM | POA: Diagnosis not present

## 2021-01-31 DIAGNOSIS — S12401S Unspecified nondisplaced fracture of fifth cervical vertebra, sequela: Secondary | ICD-10-CM | POA: Diagnosis not present

## 2021-01-31 NOTE — Progress Notes (Signed)
Pulmonary Critical Care Medicine [facility] [place]   PULMONARY CRITICAL CARE SERVICE  PROGRESS NOTE     Julian Johnson  QMV:[id number]  DOB: [birth year]   DOA: 01/18/2021  Referring Physician: Carron Curie, MD  HPI: Julian Johnson is a 80 y.o. male being followed for ventilator/airway/oxygen weaning Acute on Chronic Respiratory Failure.  Case discussed on multidisciplinary rounds the plan is to discuss further with the patient's wife regarding CODE STATUS and then plan on doing a tracheostomy.  It appears from conversations that she would like to have everything done including a tracheostomy however tracheostomy cannot be done as long as the patient is a DNR and that would have to be rescinded for the procedure  Medications: Reviewed on Rounds  Physical Exam:  Vitals: Temperature is 96.9 pulse 63 respiratory 20 blood pressure is 112/53 saturations 94%  Ventilator Settings on pressure control FiO2 is 28% IP 17 PEEP 5  General: Comfortable at this time Neck: supple Cardiovascular: no malignant arrhythmias Respiratory: No rhonchi very coarse breath sounds Skin: no rash seen on limited exam Musculoskeletal: No gross abnormality Psychiatric:unable to assess Neurologic:no involuntary movements         Lab Data:   Basic Metabolic Panel: Recent Labs  Lab 01/24/21 1213 01/27/21 0400 01/30/21 0344  NA 140 139 137  K 4.3 3.9 4.1  CL 108 108 107  CO2 26 26 25   GLUCOSE 200* 159* 170*  BUN 44* 51* 54*  CREATININE 0.61 0.65 0.69  CALCIUM 8.1* 8.1* 8.1*  MG 2.2 2.1 2.2  PHOS 3.0  --   --     ABG: No results for input(s): PHART, PCO2ART, PO2ART, HCO3, O2SAT in the last 168 hours.  Liver Function Tests: No results for input(s): AST, ALT, ALKPHOS, BILITOT, PROT, ALBUMIN in the last 168 hours. No results for input(s): LIPASE, AMYLASE in the last 168 hours. No results for input(s): AMMONIA in the last 168 hours.  CBC: Recent Labs  Lab 01/24/21 1213 01/25/21 1025  01/27/21 0400 01/30/21 0344  WBC 10.9* 11.6* 9.5 9.4  HGB 6.8* 8.5* 8.1* 8.6*  HCT 21.8* 26.4* 25.7* 27.8*  MCV 96.9 95.0 95.9 96.5  PLT 448* 441* 389 354    Cardiac Enzymes: No results for input(s): CKTOTAL, CKMB, CKMBINDEX, TROPONINI in the last 168 hours.  BNP (last 3 results) No results for input(s): BNP in the last 8760 hours.  ProBNP (last 3 results) No results for input(s): PROBNP in the last 8760 hours.  Radiological Exams: No results found.  Assessment/Plan Active Problems:   Acute on chronic respiratory failure with hypoxia (HCC)   Acute ischemic right MCA stroke (HCC)   C5 cervical fracture (HCC)   Pneumothorax   Healthcare-associated pneumonia   Acute on chronic respiratory failure hypoxia plan is going to be to discuss with the family regarding management regarding the tracheostomy Acute stroke no change we will continue to monitor closely. C5 cervical fracture will require prolonged neck collar Pneumothorax resolved Healthcare associated pneumonia treated improving   I have personally seen and evaluated the patient, evaluated laboratory and imaging results, formulated the assessment and plan and placed orders. The Patient requires high complexity decision making with multiple systems involvement.  Rounds were done with the Respiratory Therapy Director and Staff therapists and discussed with nursing staff also.  02/01/21, MD [facility] Pulmonary Critical Care Medicine Sleep Medicine

## 2021-02-01 ENCOUNTER — Other Ambulatory Visit (HOSPITAL_COMMUNITY): Payer: Self-pay

## 2021-02-01 DIAGNOSIS — I63511 Cerebral infarction due to unspecified occlusion or stenosis of right middle cerebral artery: Secondary | ICD-10-CM | POA: Diagnosis not present

## 2021-02-01 DIAGNOSIS — S12401S Unspecified nondisplaced fracture of fifth cervical vertebra, sequela: Secondary | ICD-10-CM | POA: Diagnosis not present

## 2021-02-01 DIAGNOSIS — J189 Pneumonia, unspecified organism: Secondary | ICD-10-CM | POA: Diagnosis not present

## 2021-02-01 DIAGNOSIS — J9621 Acute and chronic respiratory failure with hypoxia: Secondary | ICD-10-CM | POA: Diagnosis not present

## 2021-02-01 LAB — RENAL FUNCTION PANEL
Albumin: 1.5 g/dL — ABNORMAL LOW (ref 3.5–5.0)
Anion gap: 7 (ref 5–15)
BUN: 89 mg/dL — ABNORMAL HIGH (ref 8–23)
CO2: 27 mmol/L (ref 22–32)
Calcium: 8.8 mg/dL — ABNORMAL LOW (ref 8.9–10.3)
Chloride: 104 mmol/L (ref 98–111)
Creatinine, Ser: 0.91 mg/dL (ref 0.61–1.24)
GFR, Estimated: >60 mL/min (ref >60)
Glucose, Bld: 163 mg/dL — ABNORMAL HIGH (ref 70–99)
Phosphorus: 2.8 mg/dL (ref 2.5–4.6)
Potassium: 4.6 mmol/L (ref 3.5–5.1)
Sodium: 138 mmol/L (ref 135–145)

## 2021-02-01 LAB — CBC
HCT: 29.4 % — ABNORMAL LOW (ref 39.0–52.0)
Hemoglobin: 8.8 g/dL — ABNORMAL LOW (ref 13.0–17.0)
MCH: 29.3 pg (ref 26.0–34.0)
MCHC: 29.9 g/dL — ABNORMAL LOW (ref 30.0–36.0)
MCV: 98 fL (ref 80.0–100.0)
Platelets: 336 10*3/uL (ref 150–400)
RBC: 3 MIL/uL — ABNORMAL LOW (ref 4.22–5.81)
RDW: 16.7 % — ABNORMAL HIGH (ref 11.5–15.5)
WBC: 9.6 10*3/uL (ref 4.0–10.5)
nRBC: 0 % (ref 0.0–0.2)

## 2021-02-01 LAB — MAGNESIUM: Magnesium: 2.5 mg/dL — ABNORMAL HIGH (ref 1.7–2.4)

## 2021-02-01 NOTE — Progress Notes (Signed)
Pulmonary Critical Care Medicine [facility] [place]   PULMONARY CRITICAL CARE SERVICE  PROGRESS NOTE     Martese Vanatta  CHY:[id number]  DOB: 1941-05-25   DOA: 01/18/2021  Referring Physician: Carron Curie, MD  HPI: Craige Patel is a 80 y.o. male being followed for ventilator/airway/oxygen weaning Acute on Chronic Respiratory Failure.  Patient is comfortable right now without distress has been afebrile  Medications: Reviewed on Rounds  Physical Exam:  Vitals: Temperature is 97.1 pulse 58 respiratory 16 blood pressure 116/66 saturations 95  Ventilator Settings on pressure support FiO2 28% pressure 12/5  General: Comfortable at this time Neck: supple Cardiovascular: no malignant arrhythmias Respiratory: No rhonchi very coarse breath sounds Skin: no rash seen on limited exam Musculoskeletal: No gross abnormality Psychiatric:unable to assess Neurologic:no involuntary movements         Lab Data:   Basic Metabolic Panel: Recent Labs  Lab 01/27/21 0400 01/30/21 0344 02/01/21 0453  NA 139 137 138  K 3.9 4.1 4.6  CL 108 107 104  CO2 26 25 27   GLUCOSE 159* 170* 163*  BUN 51* 54* 89*  CREATININE 0.65 0.69 0.91  CALCIUM 8.1* 8.1* 8.8*  MG 2.1 2.2 2.5*  PHOS  --   --  2.8    ABG: No results for input(s): PHART, PCO2ART, PO2ART, HCO3, O2SAT in the last 168 hours.  Liver Function Tests: Recent Labs  Lab 02/01/21 0453  ALBUMIN 1.5*   No results for input(s): LIPASE, AMYLASE in the last 168 hours. No results for input(s): AMMONIA in the last 168 hours.  CBC: Recent Labs  Lab 01/25/21 1025 01/27/21 0400 01/30/21 0344 02/01/21 0453  WBC 11.6* 9.5 9.4 9.6  HGB 8.5* 8.1* 8.6* 8.8*  HCT 26.4* 25.7* 27.8* 29.4*  MCV 95.0 95.9 96.5 98.0  PLT 441* 389 354 336    Cardiac Enzymes: No results for input(s): CKTOTAL, CKMB, CKMBINDEX, TROPONINI in the last 168 hours.  BNP (last 3 results) No results for input(s): BNP in the last 8760 hours.  ProBNP (last  3 results) No results for input(s): PROBNP in the last 8760 hours.  Radiological Exams: DG CHEST PORT 1 VIEW  Result Date: 02/01/2021 CLINICAL DATA:  80 year old male with history of respiratory failure. Pneumonia. EXAM: PORTABLE CHEST 1 VIEW COMPARISON:  Chest x-ray 01/23/2021. FINDINGS: An endotracheal tube is in place with tip 4.8 cm above the carina. Patchy multifocal airspace consolidation is again noted in the lungs, most evident throughout the left mid to lower lung and in the right lower lobe. Small left pleural effusion. No right pleural effusion. No pneumothorax. No evidence of pulmonary edema. Heart size is normal. The patient is rotated to the left on today's exam, resulting in distortion of the mediastinal contours and reduced diagnostic sensitivity and specificity for mediastinal pathology. Atherosclerotic calcifications in the thoracic aorta. Vascular stent in the expected location of the right common carotid artery. IMPRESSION: 1. Support apparatus, as above. 2. Multilobar bilateral pneumonia, most severe in the left lower lobe with small left parapneumonic pleural effusion, similar to the prior study, as above. 3. Aortic atherosclerosis. Electronically Signed   By: 01/25/2021 M.D.   On: 02/01/2021 05:59    Assessment/Plan Active Problems:   Acute on chronic respiratory failure with hypoxia (HCC)   Acute ischemic right MCA stroke (HCC)   C5 cervical fracture (HCC)   Pneumothorax   Healthcare-associated pneumonia   Acute on chronic respiratory failure hypoxia patient has been weaning on pressure support 12/5 has been doing  well.  The patient is going to continue with the weaning protocol. C5 fracture continue to monitor closely Pneumothorax at baseline we will continue with present management Healthcare associated pneumonia treated improved Acute stroke no change continue with supportive care.   I have personally seen and evaluated the patient, evaluated laboratory and  imaging results, formulated the assessment and plan and placed orders. The Patient requires high complexity decision making with multiple systems involvement.  Rounds were done with the Respiratory Therapy Director and Staff therapists and discussed with nursing staff also.  Yevonne Pax, MD [facility] Pulmonary Critical Care Medicine Sleep Medicine

## 2021-02-02 DIAGNOSIS — J9621 Acute and chronic respiratory failure with hypoxia: Secondary | ICD-10-CM | POA: Diagnosis not present

## 2021-02-02 DIAGNOSIS — S12401S Unspecified nondisplaced fracture of fifth cervical vertebra, sequela: Secondary | ICD-10-CM | POA: Diagnosis not present

## 2021-02-02 DIAGNOSIS — I63511 Cerebral infarction due to unspecified occlusion or stenosis of right middle cerebral artery: Secondary | ICD-10-CM | POA: Diagnosis not present

## 2021-02-02 DIAGNOSIS — J189 Pneumonia, unspecified organism: Secondary | ICD-10-CM | POA: Diagnosis not present

## 2021-02-02 LAB — BASIC METABOLIC PANEL
Anion gap: 5 (ref 5–15)
BUN: 83 mg/dL — ABNORMAL HIGH (ref 8–23)
CO2: 25 mmol/L (ref 22–32)
Calcium: 8.3 mg/dL — ABNORMAL LOW (ref 8.9–10.3)
Chloride: 107 mmol/L (ref 98–111)
Creatinine, Ser: 0.93 mg/dL (ref 0.61–1.24)
GFR, Estimated: >60 mL/min (ref >60)
Glucose, Bld: 160 mg/dL — ABNORMAL HIGH (ref 70–99)
Potassium: 4.4 mmol/L (ref 3.5–5.1)
Sodium: 137 mmol/L (ref 135–145)

## 2021-02-02 LAB — PHOSPHORUS: Phosphorus: 3.1 mg/dL (ref 2.5–4.6)

## 2021-02-02 LAB — MAGNESIUM: Magnesium: 2.5 mg/dL — ABNORMAL HIGH (ref 1.7–2.4)

## 2021-02-02 NOTE — Progress Notes (Signed)
Pulmonary Critical Care Medicine [facility] [place]   PULMONARY CRITICAL CARE SERVICE  PROGRESS NOTE     Julian Johnson  ELF:[id number]  DOB: January 04, 1941   DOA: 01/18/2021  Referring Physician: Carron Curie, MD  HPI: Julian Johnson is a 80 y.o. male being followed for ventilator/airway/oxygen weaning Acute on Chronic Respiratory Failure.  Patient is comfortable right now without distress has been afebrile  Medications: Reviewed on Rounds  Physical Exam:  Vitals: Temperature is 98.1 pulse 63 respiratory 25 blood pressure is 141/66 saturations 98%  Ventilator Settings patient is pressure support pressure of 10/5  General: Comfortable at this time Neck: supple Cardiovascular: no malignant arrhythmias Respiratory: No rhonchi no rales Skin: no rash seen on limited exam Musculoskeletal: No gross abnormality Psychiatric:unable to assess Neurologic:no involuntary movements         Lab Data:   Basic Metabolic Panel: Recent Labs  Lab 01/27/21 0400 01/30/21 0344 02/01/21 0453 02/02/21 0323  NA 139 137 138 137  K 3.9 4.1 4.6 4.4  CL 108 107 104 107  CO2 26 25 27 25   GLUCOSE 159* 170* 163* 160*  BUN 51* 54* 89* 83*  CREATININE 0.65 0.69 0.91 0.93  CALCIUM 8.1* 8.1* 8.8* 8.3*  MG 2.1 2.2 2.5* 2.5*  PHOS  --   --  2.8 3.1    ABG: No results for input(s): PHART, PCO2ART, PO2ART, HCO3, O2SAT in the last 168 hours.  Liver Function Tests: Recent Labs  Lab 02/01/21 0453  ALBUMIN 1.5*   No results for input(s): LIPASE, AMYLASE in the last 168 hours. No results for input(s): AMMONIA in the last 168 hours.  CBC: Recent Labs  Lab 01/27/21 0400 01/30/21 0344 02/01/21 0453  WBC 9.5 9.4 9.6  HGB 8.1* 8.6* 8.8*  HCT 25.7* 27.8* 29.4*  MCV 95.9 96.5 98.0  PLT 389 354 336    Cardiac Enzymes: No results for input(s): CKTOTAL, CKMB, CKMBINDEX, TROPONINI in the last 168 hours.  BNP (last 3 results) No results for input(s): BNP in the last 8760 hours.  ProBNP  (last 3 results) No results for input(s): PROBNP in the last 8760 hours.  Radiological Exams: DG CHEST PORT 1 VIEW  Result Date: 02/01/2021 CLINICAL DATA:  80 year old male with history of respiratory failure. Pneumonia. EXAM: PORTABLE CHEST 1 VIEW COMPARISON:  Chest x-ray 01/23/2021. FINDINGS: An endotracheal tube is in place with tip 4.8 cm above the carina. Patchy multifocal airspace consolidation is again noted in the lungs, most evident throughout the left mid to lower lung and in the right lower lobe. Small left pleural effusion. No right pleural effusion. No pneumothorax. No evidence of pulmonary edema. Heart size is normal. The patient is rotated to the left on today's exam, resulting in distortion of the mediastinal contours and reduced diagnostic sensitivity and specificity for mediastinal pathology. Atherosclerotic calcifications in the thoracic aorta. Vascular stent in the expected location of the right common carotid artery. IMPRESSION: 1. Support apparatus, as above. 2. Multilobar bilateral pneumonia, most severe in the left lower lobe with small left parapneumonic pleural effusion, similar to the prior study, as above. 3. Aortic atherosclerosis. Electronically Signed   By: 01/25/2021 M.D.   On: 02/01/2021 05:59    Assessment/Plan Active Problems:   Acute on chronic respiratory failure with hypoxia (HCC)   Acute ischemic right MCA stroke (HCC)   C5 cervical fracture (HCC)   Pneumothorax   Healthcare-associated pneumonia   Acute on chronic respiratory failure hypoxia patient is scheduled for tracheostomy Friday plan  will be to continue with pressure support for now C5 fracture stable in neck collar Pneumothorax treated resolved Bilateral pneumonia last chest film still shows multifocal pneumonia has been treated with antibiotics Acute stroke no change we will continue to follow along closely   I have personally seen and evaluated the patient, evaluated laboratory and imaging  results, formulated the assessment and plan and placed orders. The Patient requires high complexity decision making with multiple systems involvement.  Rounds were done with the Respiratory Therapy Director and Staff therapists and discussed with nursing staff also.  Julian Pax, MD [facility] Pulmonary Critical Care Medicine Sleep Medicine

## 2021-02-03 DIAGNOSIS — J9621 Acute and chronic respiratory failure with hypoxia: Secondary | ICD-10-CM | POA: Diagnosis not present

## 2021-02-03 DIAGNOSIS — J189 Pneumonia, unspecified organism: Secondary | ICD-10-CM | POA: Diagnosis not present

## 2021-02-03 DIAGNOSIS — I63511 Cerebral infarction due to unspecified occlusion or stenosis of right middle cerebral artery: Secondary | ICD-10-CM | POA: Diagnosis not present

## 2021-02-03 DIAGNOSIS — S12401S Unspecified nondisplaced fracture of fifth cervical vertebra, sequela: Secondary | ICD-10-CM | POA: Diagnosis not present

## 2021-02-03 NOTE — Anesthesia Preprocedure Evaluation (Addendum)
Anesthesia Evaluation   Patient unresponsive    Reviewed: Allergy & Precautions, NPO status , Patient's Chart, lab work & pertinent test results  Airway Mallampati: Intubated  TM Distance: >3 FB Neck ROM: Full    Dental   Pulmonary pneumonia (pseudomonas), unresolved,  Chronic respiratory failure    + decreased breath sounds      Cardiovascular Exercise Tolerance: Good + Peripheral Vascular Disease   Rhythm:Regular Rate:Normal     Neuro/Psych CVA (Acute R MCA ) negative psych ROS   GI/Hepatic negative GI ROS, Neg liver ROS,   Endo/Other  diabetes  Renal/GU Lab Results      Component                Value               Date                      CREATININE               0.93                02/02/2021                BUN                      83 (H)              02/02/2021                NA                       137                 02/02/2021                K                        4.4                 02/02/2021                CL                       107                 02/02/2021                CO2                      25                  02/02/2021                Musculoskeletal negative musculoskeletal ROS (+)   Abdominal   Peds  Hematology  (+) anemia , Lab Results      Component                Value               Date                      WBC                      9.6  02/01/2021                HGB                      8.8 (L)             02/01/2021                HCT                      29.4 (L)            02/01/2021                MCV                      98.0                02/01/2021                PLT                      336                 02/01/2021              Anesthesia Other Findings   Reproductive/Obstetrics                            Anesthesia Physical Anesthesia Plan  ASA: 4  Anesthesia Plan: General   Post-op Pain Management:    Induction:  Inhalational  PONV Risk Score and Plan: Treatment may vary due to age or medical condition  Airway Management Planned: Tracheostomy and Oral ETT  Additional Equipment: None  Intra-op Plan:   Post-operative Plan: Post-operative intubation/ventilation  Informed Consent: I have reviewed the patients History and Physical, chart, labs and discussed the procedure including the risks, benefits and alternatives for the proposed anesthesia with the patient or authorized representative who has indicated his/her understanding and acceptance.     History available from chart only  Plan Discussed with: CRNA  Anesthesia Plan Comments:        Anesthesia Quick Evaluation

## 2021-02-03 NOTE — Progress Notes (Signed)
Pulmonary Critical Care Medicine [facility] [place]   PULMONARY CRITICAL CARE SERVICE  PROGRESS NOTE     Julian Johnson  KGM:[id number]  DOB: 05/27/1941   DOA: 01/18/2021  Referring Physician: Carron Curie, MD  HPI: Julian Johnson is a 80 y.o. male being followed for ventilator/airway/oxygen weaning Acute on Chronic Respiratory Failure.  Patient is comfortable right now without distress has been on pressure support 10/5  Medications: Reviewed on Rounds  Physical Exam:  Vitals: Temperature is 96.7 pulse 58 respiratory 19 blood pressure is 127/62 saturations 97%  Ventilator Settings on pressure support FiO2 28% pressure 10/5  General: Comfortable at this time Neck: supple Cardiovascular: no malignant arrhythmias Respiratory: No rhonchi very coarse breath sounds Skin: no rash seen on limited exam Musculoskeletal: No gross abnormality Psychiatric:unable to assess Neurologic:no involuntary movements         Lab Data:   Basic Metabolic Panel: Recent Labs  Lab 01/30/21 0344 02/01/21 0453 02/02/21 0323  NA 137 138 137  K 4.1 4.6 4.4  CL 107 104 107  CO2 25 27 25   GLUCOSE 170* 163* 160*  BUN 54* 89* 83*  CREATININE 0.69 0.91 0.93  CALCIUM 8.1* 8.8* 8.3*  MG 2.2 2.5* 2.5*  PHOS  --  2.8 3.1    ABG: No results for input(s): PHART, PCO2ART, PO2ART, HCO3, O2SAT in the last 168 hours.  Liver Function Tests: Recent Labs  Lab 02/01/21 0453  ALBUMIN 1.5*   No results for input(s): LIPASE, AMYLASE in the last 168 hours. No results for input(s): AMMONIA in the last 168 hours.  CBC: Recent Labs  Lab 01/30/21 0344 02/01/21 0453  WBC 9.4 9.6  HGB 8.6* 8.8*  HCT 27.8* 29.4*  MCV 96.5 98.0  PLT 354 336    Cardiac Enzymes: No results for input(s): CKTOTAL, CKMB, CKMBINDEX, TROPONINI in the last 168 hours.  BNP (last 3 results) No results for input(s): BNP in the last 8760 hours.  ProBNP (last 3 results) No results for input(s): PROBNP in the last 8760  hours.  Radiological Exams: No results found.  Assessment/Plan Active Problems:   Acute on chronic respiratory failure with hypoxia (HCC)   Acute ischemic right MCA stroke (HCC)   C5 cervical fracture (HCC)   Pneumothorax   Healthcare-associated pneumonia   Acute on chronic respiratory failure with hypoxia suspect that once patient has tracheostomy weaning should proceed quickly down to T-bar we will await tracheostomy to be done in the morning C5 cervical fracture supportive care right now appears to be stable Acute stroke no change we will continue to monitor Pneumothorax resolved Healthcare associated pneumonia treated slow improvement we will continue with present management   I have personally seen and evaluated the patient, evaluated laboratory and imaging results, formulated the assessment and plan and placed orders. The Patient requires high complexity decision making with multiple systems involvement.  Rounds were done with the Respiratory Therapy Director and Staff therapists and discussed with nursing staff also.  04/03/21, MD [facility] Pulmonary Critical Care Medicine Sleep Medicine

## 2021-02-04 ENCOUNTER — Encounter (HOSPITAL_COMMUNITY): Payer: Self-pay | Admitting: Anesthesiology

## 2021-02-04 ENCOUNTER — Encounter: Admission: AD | Disposition: A | Discharge status: Still In Hospital | Payer: Self-pay | Attending: Internal Medicine

## 2021-02-04 ENCOUNTER — Encounter: Payer: Self-pay | Admitting: Internal Medicine

## 2021-02-04 ENCOUNTER — Inpatient Hospital Stay (HOSPITAL_COMMUNITY): Admission: RE | Admit: 2021-02-04 | Payer: Medicare Other | Source: Home / Self Care | Admitting: Otolaryngology

## 2021-02-04 ENCOUNTER — Ambulatory Visit (INDEPENDENT_AMBULATORY_CARE_PROVIDER_SITE_OTHER): Payer: Self-pay | Admitting: Otolaryngology

## 2021-02-04 DIAGNOSIS — J9611 Chronic respiratory failure with hypoxia: Secondary | ICD-10-CM

## 2021-02-04 DIAGNOSIS — J9621 Acute and chronic respiratory failure with hypoxia: Secondary | ICD-10-CM | POA: Diagnosis not present

## 2021-02-04 DIAGNOSIS — J939 Pneumothorax, unspecified: Secondary | ICD-10-CM | POA: Diagnosis not present

## 2021-02-04 DIAGNOSIS — I63511 Cerebral infarction due to unspecified occlusion or stenosis of right middle cerebral artery: Secondary | ICD-10-CM | POA: Diagnosis not present

## 2021-02-04 DIAGNOSIS — J9620 Acute and chronic respiratory failure, unspecified whether with hypoxia or hypercapnia: Secondary | ICD-10-CM | POA: Diagnosis not present

## 2021-02-04 DIAGNOSIS — J189 Pneumonia, unspecified organism: Secondary | ICD-10-CM | POA: Diagnosis not present

## 2021-02-04 HISTORY — PX: TRACHEOSTOMY TUBE PLACEMENT: SHX814

## 2021-02-04 LAB — CBC
HCT: 29 % — ABNORMAL LOW (ref 39.0–52.0)
Hemoglobin: 8.9 g/dL — ABNORMAL LOW (ref 13.0–17.0)
MCH: 29.7 pg (ref 26.0–34.0)
MCHC: 30.7 g/dL (ref 30.0–36.0)
MCV: 96.7 fL (ref 80.0–100.0)
Platelets: 322 10*3/uL (ref 150–400)
RBC: 3 MIL/uL — ABNORMAL LOW (ref 4.22–5.81)
RDW: 16.4 % — ABNORMAL HIGH (ref 11.5–15.5)
WBC: 7.5 10*3/uL (ref 4.0–10.5)
nRBC: 0 % (ref 0.0–0.2)

## 2021-02-04 LAB — BASIC METABOLIC PANEL
Anion gap: 5 (ref 5–15)
BUN: 70 mg/dL — ABNORMAL HIGH (ref 8–23)
CO2: 27 mmol/L (ref 22–32)
Calcium: 8.6 mg/dL — ABNORMAL LOW (ref 8.9–10.3)
Chloride: 106 mmol/L (ref 98–111)
Creatinine, Ser: 0.76 mg/dL (ref 0.61–1.24)
GFR, Estimated: >60 mL/min (ref >60)
Glucose, Bld: 129 mg/dL — ABNORMAL HIGH (ref 70–99)
Potassium: 4.4 mmol/L (ref 3.5–5.1)
Sodium: 138 mmol/L (ref 135–145)

## 2021-02-04 LAB — PROTIME-INR
INR: 1.3 — ABNORMAL HIGH (ref 0.8–1.2)
Prothrombin Time: 15.7 seconds — ABNORMAL HIGH (ref 11.4–15.2)

## 2021-02-04 LAB — PHOSPHORUS: Phosphorus: 3.7 mg/dL (ref 2.5–4.6)

## 2021-02-04 LAB — MAGNESIUM: Magnesium: 2.2 mg/dL (ref 1.7–2.4)

## 2021-02-04 SURGERY — CREATION, TRACHEOSTOMY
Anesthesia: General | Site: Neck

## 2021-02-04 SURGERY — CREATION, TRACHEOSTOMY
Anesthesia: General

## 2021-02-04 MED ORDER — MIDAZOLAM HCL 2 MG/2ML IJ SOLN
INTRAMUSCULAR | Status: AC
  Filled 2021-02-04: qty 2

## 2021-02-04 MED ORDER — FENTANYL CITRATE (PF) 250 MCG/5ML IJ SOLN
INTRAMUSCULAR | Status: AC
  Filled 2021-02-04: qty 5

## 2021-02-04 MED ORDER — ONDANSETRON HCL 4 MG/2ML IJ SOLN
INTRAMUSCULAR | Status: DC | PRN
  Administered 2021-02-04: 4 mg via INTRAVENOUS

## 2021-02-04 MED ORDER — EPHEDRINE SULFATE-NACL 50-0.9 MG/10ML-% IV SOSY
PREFILLED_SYRINGE | INTRAVENOUS | Status: DC | PRN
  Administered 2021-02-04 (×2): 10 mg via INTRAVENOUS

## 2021-02-04 MED ORDER — 0.9 % SODIUM CHLORIDE (POUR BTL) OPTIME
TOPICAL | Status: DC | PRN
  Administered 2021-02-04: 1000 mL

## 2021-02-04 MED ORDER — DEXAMETHASONE SODIUM PHOSPHATE 10 MG/ML IJ SOLN
INTRAMUSCULAR | Status: DC | PRN
  Administered 2021-02-04: 5 mg via INTRAVENOUS

## 2021-02-04 MED ORDER — FENTANYL CITRATE (PF) 100 MCG/2ML IJ SOLN
INTRAMUSCULAR | Status: DC | PRN
  Administered 2021-02-04: 100 ug via INTRAVENOUS

## 2021-02-04 MED ORDER — LACTATED RINGERS IV SOLN: INTRAVENOUS | Status: DC | PRN

## 2021-02-04 MED ORDER — MIDAZOLAM HCL 2 MG/2ML IJ SOLN
INTRAMUSCULAR | Status: DC | PRN
  Administered 2021-02-04: 2 mg via INTRAVENOUS

## 2021-02-04 MED ORDER — PROPOFOL 10 MG/ML IV BOLUS
INTRAVENOUS | Status: AC
  Filled 2021-02-04: qty 20

## 2021-02-04 MED ORDER — PHENYLEPHRINE 40 MCG/ML (10ML) SYRINGE FOR IV PUSH (FOR BLOOD PRESSURE SUPPORT)
PREFILLED_SYRINGE | INTRAVENOUS | Status: DC | PRN
  Administered 2021-02-04: 80 ug via INTRAVENOUS
  Administered 2021-02-04: 40 ug via INTRAVENOUS

## 2021-02-04 MED ORDER — LIDOCAINE-EPINEPHRINE 1 %-1:100000 IJ SOLN
INTRAMUSCULAR | Status: DC | PRN
  Administered 2021-02-04: 5 mL

## 2021-02-04 SURGICAL SUPPLY — 35 items
ATTRACTOMAT 16X20 MAGNETIC DRP (DRAPES) IMPLANT
BAG COUNTER SPONGE SURGICOUNT (BAG) ×2 IMPLANT
BAG SPNG CNTER NS LX DISP (BAG) ×1
BLADE SURG 15 STRL LF DISP TIS (BLADE) ×1 IMPLANT
BLADE SURG 15 STRL SS (BLADE) ×2
CLEANER TIP ELECTROSURG 2X2 (MISCELLANEOUS) ×2 IMPLANT
COVER SURGICAL LIGHT HANDLE (MISCELLANEOUS) ×2 IMPLANT
DRAPE HALF SHEET 40X57 (DRAPES) ×2 IMPLANT
ELECT COATED BLADE 2.86 ST (ELECTRODE) ×2 IMPLANT
ELECT REM PT RETURN 9FT ADLT (ELECTROSURGICAL) ×2
ELECTRODE REM PT RTRN 9FT ADLT (ELECTROSURGICAL) ×1 IMPLANT
GAUZE 4X4 16PLY ~~LOC~~+RFID DBL (SPONGE) ×2 IMPLANT
GLOVE SURG MICRO LTX SZ7.5 (GLOVE) ×2 IMPLANT
GOWN STRL REUS W/ TWL LRG LVL3 (GOWN DISPOSABLE) ×1 IMPLANT
GOWN STRL REUS W/ TWL XL LVL3 (GOWN DISPOSABLE) ×1 IMPLANT
GOWN STRL REUS W/TWL LRG LVL3 (GOWN DISPOSABLE) ×2
GOWN STRL REUS W/TWL XL LVL3 (GOWN DISPOSABLE) ×2
HOLDER TRACH TUBE VELCRO 19.5 (MISCELLANEOUS) ×2 IMPLANT
KIT BASIN OR (CUSTOM PROCEDURE TRAY) ×2 IMPLANT
KIT SUCTION CATH 14FR (SUCTIONS) ×2 IMPLANT
KIT TURNOVER KIT B (KITS) ×2 IMPLANT
NEEDLE HYPO 25GX1X1/2 BEV (NEEDLE) ×2 IMPLANT
NS IRRIG 1000ML POUR BTL (IV SOLUTION) ×2 IMPLANT
PACK EENT II TURBAN DRAPE (CUSTOM PROCEDURE TRAY) ×2 IMPLANT
PENCIL BUTTON HOLSTER BLD 10FT (ELECTRODE) ×2 IMPLANT
SPONGE DRAIN TRACH 4X4 STRL 2S (GAUZE/BANDAGES/DRESSINGS) ×2 IMPLANT
SPONGE INTESTINAL PEANUT (DISPOSABLE) ×2 IMPLANT
SUT SILK 2 0 SH CR/8 (SUTURE) ×2 IMPLANT
SUT SILK 3 0 TIES 10X30 (SUTURE) IMPLANT
SYR 5ML LUER SLIP (SYRINGE) ×2 IMPLANT
SYR CONTROL 10ML LL (SYRINGE) ×2 IMPLANT
TOWEL GREEN STERILE (TOWEL DISPOSABLE) ×2 IMPLANT
TOWEL GREEN STERILE FF (TOWEL DISPOSABLE) ×2 IMPLANT
TUBE CONNECTING 12X1/4 (SUCTIONS) ×2 IMPLANT
TUBE TRACH 6 EXL DIST CUF (TUBING) ×2 IMPLANT

## 2021-02-04 NOTE — Op Note (Signed)
NAMEVICTORIO, Julian Johnson MEDICAL RECORD NO: [id number] ACCOUNT NO: [id number] DATE OF BIRTH: Nov 03, 1940 FACILITY: [facility] LOCATION: [facility] PHYSICIAN: Kristine Garbe. Ezzard Standing, MD  Operative Report   DATE OF PROCEDURE: 02/04/2021   PREOPERATIVE DIAGNOSIS:  Respiratory failure.  POSTOPERATIVE DIAGNOSIS:  Respiratory failure.  OPERATION PERFORMED:  Tracheostomy with a #6 Shiley tracheostomy tube.  SURGEON:  Dr. Dillard Cannon.  ANESTHESIA:  General endotracheal.  ESTIMATED BLOOD LOSS:  Minimal.  COMPLICATIONS:  None.  BRIEF CLINICAL NOTE:  The patient is a 80 year old gentleman who apparently fell little over 6 weeks ago sustaining a neck fracture and a head injury.  He had respiratory failure and required intubation at outside hospital.  He was subsequently  transferred to [facility] about 2 weeks ago, intubated with respiratory failure and weaning.  The patient has been unsuccessful and there was some concern about difficulty extubating him because of the neck fracture.  He wears C-collar and  on discussion with the neurosurgeon who took care of him at [facility], it will be okay to take the C-collar off in order to perform the tracheostomy.  He is taken to the operating room at this time for tracheostomy.  DESCRIPTION OF PROCEDURE:  The patient was brought straight down from [facility], intubated.  The C-collar was removed.  The neck was kept in the midline and was slightly extended.  The neck was then prepped with Hibiclens in midline where  the tracheotomy was planned.  The area was marked out and injected with a 4-5 mL of Xylocaine with epinephrine for hemostasis.  A vertical incision was made just below the cricoid cartilage extending down about 1.5 cm.  Dissection was carried down  through subcutaneous tissue with cautery.  Retractors were used to retract the strap muscles in the midline.  They were divided midline and retracted laterally.  The cricoid  cartilage was identified and the isthmus of the thyroid was just below the  cricoid cartilage was divided in order to expose the first two tracheal rings.  There was minimal bleeding that was controlled with cautery.  A horizontal tracheotomy was made at the inferior portion of the first tracheal ring just below the cricoid  cartilage.  A #6 tracheostomy tube was inserted after withdrawing the endotracheal tube.  This was placed easily and the patient was ventilated well.  It was then secured to the neck with 2-0 silk sutures x 4 and a Velcro trach collar around the neck.  A  tracheal sponge was placed at the inferior aspect of the tracheostomy so as to prevent tracheostomy digging into the skin.  The patient was subsequently transferred back to [facility].     SUJ D: 02/04/2021 2:30:36 pm T: 02/04/2021 10:44:00 pm  JOB: [id number]/ [id number]

## 2021-02-04 NOTE — H&P (Signed)
PREOPERATIVE H&P  Chief Complaint: Respiratory failure  HPI: Julian Johnson is a 80 y.o. male who presents for evaluation of respiratory failure referred by pulmonary at [facility].  Patient has history of lung cancer status post lobectomy.  Also has history of severe COPD with exacerbation secondary to COVID little over a month ago.  He required acute intubation because of respiratory failure in the outside hospital room.  He was extubated and had to be reintubated.  [facility] and he is taken to the operating with this time for tracheostomy.  On 01/21/2021 intubated and on ventilator.  Initial attempt at extubation was unsuccessful and patient had to be reintubated.  He has a c-collar on because of a C-spine fracture he sustained 6 weeks ago.  Pulmonary therapy recommended tracheotomy and he is taken operating room this time for tracheostomy.  No past medical history on file. No past surgical history on file. Social History   Socioeconomic History   Marital status: Married    Spouse name: Not on file   Number of children: Not on file   Years of education: Not on file   Highest education level: Not on file  Occupational History   Not on file  Tobacco Use   Smoking status: Not on file   Smokeless tobacco: Not on file  Substance and Sexual Activity   Alcohol use: Not on file   Drug use: Not on file   Sexual activity: Not on file  Other Topics Concern   Not on file  Social History Narrative   Not on file   Social Determinants of Health   Financial Resource Strain: Not on file  Food Insecurity: Not on file  Transportation Needs: Not on file  Physical Activity: Not on file  Stress: Not on file  Social Connections: Not on file   No family history on file. Not on File Prior to Admission medications   Not on File     Positive ROS: Otherwise negative  All other systems have been reviewed and were otherwise  negative with the exception of those mentioned in the HPI and as above.  Physical Exam: There were no vitals filed for this visit.  General:no acute distress.  Patient is not that responsive. Oral: Normal oral mucosa and tonsils Nasal: Clear nasal passages Neck: No palpable adenopathy or thyroid nodules.  Trachea is midline.  Patient has a c-collar on. Ear: Ear canal is clear with normal appearing TMs Cardiovascular: Regular rate and rhythm, no murmur.  Respiratory: Clear to auscultation Neurologic: Patient is intubated and not that responsive.   Assessment/Plan: Respiratory failure  Plan for tracheostomy  Dillard Cannon, MD 02/04/2021 12:13 PM

## 2021-02-04 NOTE — Brief Op Note (Signed)
02/04/2021  2:24 PM  PATIENT:  Julian Johnson  80 y.o. male  PRE-OPERATIVE DIAGNOSIS:  respiratory failure  POST-OPERATIVE DIAGNOSIS:  respiratory failure  PROCEDURE:  Procedure(s): TRACHEOSTOMY (N/A)  # 6 Shiley  SURGEON:  Surgeon(s) and Role:    * Drema Halon, MD - Primary  PHYSICIAN ASSISTANT:   ASSISTANTS: none   ANESTHESIA:   general  EBL:  minimal   BLOOD ADMINISTERED:none  DRAINS: none   LOCAL MEDICATIONS USED:  XYLOCAINE   SPECIMEN:  No Specimen  DISPOSITION OF SPECIMEN:  N/A  COUNTS:  YES  TOURNIQUET:  * No tourniquets in log *  DICTATION: .Other Dictation: Dictation Number [id number]  PLAN OF CARE: Discharge to home after PACU  PATIENT DISPOSITION:  PACU - hemodynamically stable.   Delay start of Pharmacological VTE agent (>24hrs) due to surgical blood loss or risk of bleeding: yes

## 2021-02-04 NOTE — Transfer of Care (Signed)
Immediate Anesthesia Transfer of Care Note  Patient: Julian Johnson  Procedure(s) Performed: TRACHEOSTOMY (Neck)  Patient Location: Nursing Unit  Anesthesia Type:General  Level of Consciousness: drowsy and patient cooperative  Airway & Oxygen Therapy: Patient remains intubated per anesthesia plan and Patient placed on Ventilator (see vital sign flow sheet for setting)  Post-op Assessment: Report given to RN and Post -op Vital signs reviewed and stable  Post vital signs: Reviewed and stable  Last Vitals:  Vitals Value Taken Time  BP    Temp    Pulse    Resp    SpO2      Last Pain: There were no vitals filed for this visit.       Complications: No notable events documented.

## 2021-02-04 NOTE — Anesthesia Postprocedure Evaluation (Signed)
Anesthesia Post Note  Patient: Julian Johnson  Procedure(s) Performed: TRACHEOSTOMY (Neck)     Patient location during evaluation: Specials Recovery Anesthesia Type: General Level of consciousness: sedated Pain management: pain level controlled Vital Signs Assessment: post-procedure vital signs reviewed and stable Respiratory status: patient remains intubated per anesthesia plan Cardiovascular status: stable Postop Assessment: no apparent nausea or vomiting Anesthetic complications: no   No notable events documented.  Last Vitals: There were no vitals filed for this visit.  Last Pain: There were no vitals filed for this visit.               Trevor Iha

## 2021-02-04 NOTE — Progress Notes (Signed)
Pulmonary Critical Care Medicine [facility] [place]   PULMONARY CRITICAL CARE SERVICE  PROGRESS NOTE     Julian Johnson  KGY:[id number]  DOB: 11-23-1940   DOA: 01/18/2021  Referring Physician: Carron Curie, MD  HPI: Julian Johnson is a 80 y.o. male being followed for ventilator/airway/oxygen weaning Acute on Chronic Respiratory Failure.  Patient is afebrile right now comfortable without distress has been on pressure support mode has been on 25% FiO2 with pressure of 10/5  Medications: Reviewed on Rounds  Physical Exam:  Vitals: Temperature 97.9 pulse 68 respiratory 28 blood pressure 125/73 saturations 94%  Ventilator Settings patient currently is on pressure support FiO2 28% pressure 10/5 tidal volume 476  General: Comfortable at this time Neck: supple Cardiovascular: no malignant arrhythmias Respiratory: No rhonchi no rales are noted at this time Skin: no rash seen on limited exam Musculoskeletal: No gross abnormality Psychiatric:unable to assess Neurologic:no involuntary movements         Lab Data:   Basic Metabolic Panel: Recent Labs  Lab 01/30/21 0344 02/01/21 0453 02/02/21 0323 02/04/21 0454  NA 137 138 137 138  K 4.1 4.6 4.4 4.4  CL 107 104 107 106  CO2 25 27 25 27   GLUCOSE 170* 163* 160* 129*  BUN 54* 89* 83* 70*  CREATININE 0.69 0.91 0.93 0.76  CALCIUM 8.1* 8.8* 8.3* 8.6*  MG 2.2 2.5* 2.5* 2.2  PHOS  --  2.8 3.1 3.7    ABG: No results for input(s): PHART, PCO2ART, PO2ART, HCO3, O2SAT in the last 168 hours.  Liver Function Tests: Recent Labs  Lab 02/01/21 0453  ALBUMIN 1.5*   No results for input(s): LIPASE, AMYLASE in the last 168 hours. No results for input(s): AMMONIA in the last 168 hours.  CBC: Recent Labs  Lab 01/30/21 0344 02/01/21 0453 02/04/21 0454  WBC 9.4 9.6 7.5  HGB 8.6* 8.8* 8.9*  HCT 27.8* 29.4* 29.0*  MCV 96.5 98.0 96.7  PLT 354 336 322    Cardiac Enzymes: No results for input(s): CKTOTAL, CKMB, CKMBINDEX,  TROPONINI in the last 168 hours.  BNP (last 3 results) No results for input(s): BNP in the last 8760 hours.  ProBNP (last 3 results) No results for input(s): PROBNP in the last 8760 hours.  Radiological Exams: No results found.  Assessment/Plan Active Problems:   Acute on chronic respiratory failure with hypoxia (HCC)   Acute ischemic right MCA stroke (HCC)   C5 cervical fracture (HCC)   Pneumothorax   Healthcare-associated pneumonia   Acute on chronic respiratory failure hypoxia patient is planned for tracheostomy to be done today once the tracheostomy is done I think we will be able to quickly wean him down to T collar trials C5 fracture supportive care Acute stroke no change therapy as tolerated Pneumothorax no change resolved Healthcare associated pneumonia has been treated with antibiotics   I have personally seen and evaluated the patient, evaluated laboratory and imaging results, formulated the assessment and plan and placed orders. The Patient requires high complexity decision making with multiple systems involvement.  Rounds were done with the Respiratory Therapy Director and Staff therapists and discussed with nursing staff also.  04/06/21, MD [facility] Pulmonary Critical Care Medicine Sleep Medicine

## 2021-02-05 LAB — BASIC METABOLIC PANEL
Anion gap: 7 (ref 5–15)
BUN: 66 mg/dL — ABNORMAL HIGH (ref 8–23)
CO2: 25 mmol/L (ref 22–32)
Calcium: 8.4 mg/dL — ABNORMAL LOW (ref 8.9–10.3)
Chloride: 107 mmol/L (ref 98–111)
Creatinine, Ser: 0.79 mg/dL (ref 0.61–1.24)
GFR, Estimated: >60 mL/min (ref >60)
Glucose, Bld: 190 mg/dL — ABNORMAL HIGH (ref 70–99)
Potassium: 4.7 mmol/L (ref 3.5–5.1)
Sodium: 139 mmol/L (ref 135–145)

## 2021-02-05 LAB — CBC
HCT: 27.9 % — ABNORMAL LOW (ref 39.0–52.0)
Hemoglobin: 8.7 g/dL — ABNORMAL LOW (ref 13.0–17.0)
MCH: 29.8 pg (ref 26.0–34.0)
MCHC: 31.2 g/dL (ref 30.0–36.0)
MCV: 95.5 fL (ref 80.0–100.0)
Platelets: 283 10*3/uL (ref 150–400)
RBC: 2.92 MIL/uL — ABNORMAL LOW (ref 4.22–5.81)
RDW: 15.9 % — ABNORMAL HIGH (ref 11.5–15.5)
WBC: 9.2 10*3/uL (ref 4.0–10.5)
nRBC: 0 % (ref 0.0–0.2)

## 2021-02-05 LAB — PHOSPHORUS: Phosphorus: 3.3 mg/dL (ref 2.5–4.6)

## 2021-02-05 LAB — MAGNESIUM: Magnesium: 2 mg/dL (ref 1.7–2.4)

## 2021-02-06 ENCOUNTER — Other Ambulatory Visit (HOSPITAL_COMMUNITY): Payer: Self-pay

## 2021-02-06 DIAGNOSIS — I63511 Cerebral infarction due to unspecified occlusion or stenosis of right middle cerebral artery: Secondary | ICD-10-CM | POA: Diagnosis not present

## 2021-02-06 DIAGNOSIS — S12401S Unspecified nondisplaced fracture of fifth cervical vertebra, sequela: Secondary | ICD-10-CM | POA: Diagnosis not present

## 2021-02-06 DIAGNOSIS — J9621 Acute and chronic respiratory failure with hypoxia: Secondary | ICD-10-CM | POA: Diagnosis not present

## 2021-02-06 DIAGNOSIS — J189 Pneumonia, unspecified organism: Secondary | ICD-10-CM | POA: Diagnosis not present

## 2021-02-06 LAB — CBC
HCT: 28.6 % — ABNORMAL LOW (ref 39.0–52.0)
Hemoglobin: 9 g/dL — ABNORMAL LOW (ref 13.0–17.0)
MCH: 30.3 pg (ref 26.0–34.0)
MCHC: 31.5 g/dL (ref 30.0–36.0)
MCV: 96.3 fL (ref 80.0–100.0)
Platelets: 301 10*3/uL (ref 150–400)
RBC: 2.97 MIL/uL — ABNORMAL LOW (ref 4.22–5.81)
RDW: 15.9 % — ABNORMAL HIGH (ref 11.5–15.5)
WBC: 10.8 10*3/uL — ABNORMAL HIGH (ref 4.0–10.5)
nRBC: 0 % (ref 0.0–0.2)

## 2021-02-06 NOTE — Progress Notes (Signed)
Pulmonary Critical Care Medicine [facility] [place]   PULMONARY CRITICAL CARE SERVICE  PROGRESS NOTE     Julian Johnson  [id number]  DOB: [birth year]   DOA: 01/18/2021  Referring Physician: Carron Curie, MD  HPI: Julian Johnson is a 80 y.o. male being followed for ventilator/airway/oxygen weaning Acute on Chronic Respiratory Failure.  Patient had tracheostomy done yesterday doing quite well is on pressure support should be ready for T collar today  Medications: Reviewed on Rounds  Physical Exam:  Vitals: Temperature is 98 pulse 75 respiratory rate is 24 blood pressure is 105/56 saturations 94%  Ventilator Settings currently on pressure support pressure of 12/5 FiO2 28%  General: Comfortable at this time Neck: supple Cardiovascular: no malignant arrhythmias Respiratory: No rhonchi no rales noted at this time Skin: no rash seen on limited exam Musculoskeletal: No gross abnormality Psychiatric:unable to assess Neurologic:no involuntary movements         Lab Data:   Basic Metabolic Panel: Recent Labs  Lab 02/01/21 0453 02/02/21 0323 02/04/21 0454 02/05/21 0610  NA 138 137 138 139  K 4.6 4.4 4.4 4.7  CL 104 107 106 107  CO2 27 25 27 25   GLUCOSE 163* 160* 129* 190*  BUN 89* 83* 70* 66*  CREATININE 0.91 0.93 0.76 0.79  CALCIUM 8.8* 8.3* 8.6* 8.4*  MG 2.5* 2.5* 2.2 2.0  PHOS 2.8 3.1 3.7 3.3    ABG: No results for input(s): PHART, PCO2ART, PO2ART, HCO3, O2SAT in the last 168 hours.  Liver Function Tests: Recent Labs  Lab 02/01/21 0453  ALBUMIN 1.5*   No results for input(s): LIPASE, AMYLASE in the last 168 hours. No results for input(s): AMMONIA in the last 168 hours.  CBC: Recent Labs  Lab 02/01/21 0453 02/04/21 0454 02/05/21 0610 02/06/21 0325  WBC 9.6 7.5 9.2 10.8*  HGB 8.8* 8.9* 8.7* 9.0*  HCT 29.4* 29.0* 27.9* 28.6*  MCV 98.0 96.7 95.5 96.3  PLT 336 322 283 301    Cardiac Enzymes: No results for input(s): CKTOTAL, CKMB, CKMBINDEX,  TROPONINI in the last 168 hours.  BNP (last 3 results) No results for input(s): BNP in the last 8760 hours.  ProBNP (last 3 results) No results for input(s): PROBNP in the last 8760 hours.  Radiological Exams: DG CHEST PORT 1 VIEW  Result Date: 02/06/2021 CLINICAL DATA:  Evaluate multi lobar pneumonia EXAM: PORTABLE CHEST 1 VIEW COMPARISON:  Five days ago FINDINGS: Tracheostomy tube in place. Right stent presumably in the carotid. Infiltrates at the left more than right base which is similar are slightly progressed on the right. Stable heart size which is not enlarged. No visible pneumothorax or cavitation. Small left pleural effusion. IMPRESSION: Stable to mildly progressed bilateral pneumonia. Electronically Signed   By: 04/08/2021 M.D.   On: 02/06/2021 07:57    Assessment/Plan Active Problems:   Acute on chronic respiratory failure with hypoxia (HCC)   Acute ischemic right MCA stroke (HCC)   C5 cervical fracture (HCC)   Pneumothorax   Healthcare-associated pneumonia   Acute on chronic respiratory failure hypoxia plan is going to continue to advance the weaning to T collar today Acute stroke no change we will continue with present management C5 cervical fracture no change continue with supportive care Healthcare associated pneumonia treated slowly improving Pneumothorax resolved   I have personally seen and evaluated the patient, evaluated laboratory and imaging results, formulated the assessment and plan and placed orders. The Patient requires high complexity decision making with multiple systems involvement.  Rounds were done with the Respiratory Therapy Director and Staff therapists and discussed with nursing staff also.  Allyne Gee, MD [facility] Pulmonary Critical Care Medicine Sleep Medicine

## 2021-02-07 ENCOUNTER — Encounter (HOSPITAL_COMMUNITY): Payer: Self-pay | Admitting: Otolaryngology

## 2021-02-07 DIAGNOSIS — I63511 Cerebral infarction due to unspecified occlusion or stenosis of right middle cerebral artery: Secondary | ICD-10-CM | POA: Diagnosis not present

## 2021-02-07 DIAGNOSIS — J939 Pneumothorax, unspecified: Secondary | ICD-10-CM

## 2021-02-07 DIAGNOSIS — S12401S Unspecified nondisplaced fracture of fifth cervical vertebra, sequela: Secondary | ICD-10-CM | POA: Diagnosis not present

## 2021-02-07 DIAGNOSIS — J189 Pneumonia, unspecified organism: Secondary | ICD-10-CM | POA: Diagnosis not present

## 2021-02-07 DIAGNOSIS — J9621 Acute and chronic respiratory failure with hypoxia: Secondary | ICD-10-CM | POA: Diagnosis not present

## 2021-02-07 LAB — CBC
HCT: 30.2 % — ABNORMAL LOW (ref 39.0–52.0)
Hemoglobin: 9.6 g/dL — ABNORMAL LOW (ref 13.0–17.0)
MCH: 30 pg (ref 26.0–34.0)
MCHC: 31.8 g/dL (ref 30.0–36.0)
MCV: 94.4 fL (ref 80.0–100.0)
Platelets: 297 10*3/uL (ref 150–400)
RBC: 3.2 MIL/uL — ABNORMAL LOW (ref 4.22–5.81)
RDW: 15.7 % — ABNORMAL HIGH (ref 11.5–15.5)
WBC: 8.8 10*3/uL (ref 4.0–10.5)
nRBC: 0 % (ref 0.0–0.2)

## 2021-02-07 LAB — BASIC METABOLIC PANEL
Anion gap: 8 (ref 5–15)
BUN: 53 mg/dL — ABNORMAL HIGH (ref 8–23)
CO2: 27 mmol/L (ref 22–32)
Calcium: 8.4 mg/dL — ABNORMAL LOW (ref 8.9–10.3)
Chloride: 105 mmol/L (ref 98–111)
Creatinine, Ser: 0.69 mg/dL (ref 0.61–1.24)
GFR, Estimated: >60 mL/min (ref >60)
Glucose, Bld: 189 mg/dL — ABNORMAL HIGH (ref 70–99)
Potassium: 4.3 mmol/L (ref 3.5–5.1)
Sodium: 140 mmol/L (ref 135–145)

## 2021-02-07 LAB — PHOSPHORUS: Phosphorus: 2.9 mg/dL (ref 2.5–4.6)

## 2021-02-07 LAB — MAGNESIUM: Magnesium: 2 mg/dL (ref 1.7–2.4)

## 2021-02-07 NOTE — Progress Notes (Signed)
Pulmonary Critical Care Medicine [facility] [place]   PULMONARY CRITICAL CARE SERVICE  PROGRESS NOTE     Julian Johnson  [id number]  DOB: 1941-06-05   DOA: 01/18/2021  Referring Physician: Carron Curie, MD  HPI: Julian Johnson is a 80 y.o. male being followed for ventilator/airway/oxygen weaning Acute on Chronic Respiratory Failure.  Patient is afebrile right now resting comfortably without distress at this time has been on T collar on 28% FiO2 will be completing 24 hours today and  Medications: Reviewed on Rounds  Physical Exam:  Vitals: Temperature is 97.9 pulse 85 respiratory rate is 27 blood pressure is 140/68 saturations 99%  Ventilator Settings on T collar FiO2 28%  General: Comfortable at this time Neck: supple Cardiovascular: no malignant arrhythmias Respiratory: No rhonchi no rales are noted at this time Skin: no rash seen on limited exam Musculoskeletal: No gross abnormality Psychiatric:unable to assess Neurologic:no involuntary movements         Lab Data:   Basic Metabolic Panel: Recent Labs  Lab 02/01/21 0453 02/02/21 0323 02/04/21 0454 02/05/21 0610 02/07/21 0420  NA 138 137 138 139 140  K 4.6 4.4 4.4 4.7 4.3  CL 104 107 106 107 105  CO2 27 25 27 25 27   GLUCOSE 163* 160* 129* 190* 189*  BUN 89* 83* 70* 66* 53*  CREATININE 0.91 0.93 0.76 0.79 0.69  CALCIUM 8.8* 8.3* 8.6* 8.4* 8.4*  MG 2.5* 2.5* 2.2 2.0 2.0  PHOS 2.8 3.1 3.7 3.3 2.9    ABG: No results for input(s): PHART, PCO2ART, PO2ART, HCO3, O2SAT in the last 168 hours.  Liver Function Tests: Recent Labs  Lab 02/01/21 0453  ALBUMIN 1.5*   No results for input(s): LIPASE, AMYLASE in the last 168 hours. No results for input(s): AMMONIA in the last 168 hours.  CBC: Recent Labs  Lab 02/01/21 0453 02/04/21 0454 02/05/21 0610 02/06/21 0325 02/07/21 0420  WBC 9.6 7.5 9.2 10.8* 8.8  HGB 8.8* 8.9* 8.7* 9.0* 9.6*  HCT 29.4* 29.0* 27.9* 28.6* 30.2*  MCV 98.0 96.7 95.5 96.3  94.4  PLT 336 322 283 301 297    Cardiac Enzymes: No results for input(s): CKTOTAL, CKMB, CKMBINDEX, TROPONINI in the last 168 hours.  BNP (last 3 results) No results for input(s): BNP in the last 8760 hours.  ProBNP (last 3 results) No results for input(s): PROBNP in the last 8760 hours.  Radiological Exams: DG CHEST PORT 1 VIEW  Result Date: 02/06/2021 CLINICAL DATA:  Evaluate multi lobar pneumonia EXAM: PORTABLE CHEST 1 VIEW COMPARISON:  Five days ago FINDINGS: Tracheostomy tube in place. Right stent presumably in the carotid. Infiltrates at the left more than right base which is similar are slightly progressed on the right. Stable heart size which is not enlarged. No visible pneumothorax or cavitation. Small left pleural effusion. IMPRESSION: Stable to mildly progressed bilateral pneumonia. Electronically Signed   By: 04/08/2021 M.D.   On: 02/06/2021 07:57    Assessment/Plan Active Problems:   Acute on chronic respiratory failure with hypoxia (HCC)   Acute ischemic right MCA stroke (HCC)   C5 cervical fracture (HCC)   Pneumothorax   Healthcare-associated pneumonia   Acute on chronic respiratory failure hypoxia we will continue with the T-piece completing 24 hours today Acute stroke no change continue with supportive care C5 fracture patient is at baseline Pneumothorax resolving Healthcare associated pneumonia treated   I have personally seen and evaluated the patient, evaluated laboratory and imaging results, formulated the assessment and plan and  placed orders. The Patient requires high complexity decision making with multiple systems involvement.  Rounds were done with the Respiratory Therapy Director and Staff therapists and discussed with nursing staff also.  Allyne Gee, MD [facility] Pulmonary Critical Care Medicine Sleep Medicine

## 2021-02-08 ENCOUNTER — Other Ambulatory Visit (HOSPITAL_COMMUNITY): Payer: Self-pay

## 2021-02-08 DIAGNOSIS — S12401S Unspecified nondisplaced fracture of fifth cervical vertebra, sequela: Secondary | ICD-10-CM | POA: Diagnosis not present

## 2021-02-08 DIAGNOSIS — J189 Pneumonia, unspecified organism: Secondary | ICD-10-CM | POA: Diagnosis not present

## 2021-02-08 DIAGNOSIS — J9621 Acute and chronic respiratory failure with hypoxia: Secondary | ICD-10-CM | POA: Diagnosis not present

## 2021-02-08 DIAGNOSIS — I63511 Cerebral infarction due to unspecified occlusion or stenosis of right middle cerebral artery: Secondary | ICD-10-CM | POA: Diagnosis not present

## 2021-02-08 DIAGNOSIS — J939 Pneumothorax, unspecified: Secondary | ICD-10-CM

## 2021-02-08 NOTE — Progress Notes (Signed)
Pulmonary Critical Care Medicine [facility] [place]   PULMONARY CRITICAL CARE SERVICE  PROGRESS NOTE     Julian Johnson  [id number]  DOB: 1940-09-04   DOA: 01/18/2021  Referring Physician: Carron Curie, MD  HPI: Julian Johnson is a 80 y.o. male being followed for ventilator/airway/oxygen weaning Acute on Chronic Respiratory Failure.  Patient is currently on T collar has been having issues with secretions swelling goal is for 16 hours.  Medications: Reviewed on Rounds  Physical Exam:  Vitals: Temperature is 99.5 pulse 75 respiratory rate is 36 blood pressure is 112/61 saturations 96%  Ventilator Settings on T collar right now  General: Comfortable at this time Neck: supple Cardiovascular: no malignant arrhythmias Respiratory: No rhonchi no rales are noted at this time Skin: no rash seen on limited exam Musculoskeletal: No gross abnormality Psychiatric:unable to assess Neurologic:no involuntary movements         Lab Data:   Basic Metabolic Panel: Recent Labs  Lab 02/02/21 0323 02/04/21 0454 02/05/21 0610 02/07/21 0420  NA 137 138 139 140  K 4.4 4.4 4.7 4.3  CL 107 106 107 105  CO2 25 27 25 27   GLUCOSE 160* 129* 190* 189*  BUN 83* 70* 66* 53*  CREATININE 0.93 0.76 0.79 0.69  CALCIUM 8.3* 8.6* 8.4* 8.4*  MG 2.5* 2.2 2.0 2.0  PHOS 3.1 3.7 3.3 2.9    ABG: No results for input(s): PHART, PCO2ART, PO2ART, HCO3, O2SAT in the last 168 hours.  Liver Function Tests: No results for input(s): AST, ALT, ALKPHOS, BILITOT, PROT, ALBUMIN in the last 168 hours. No results for input(s): LIPASE, AMYLASE in the last 168 hours. No results for input(s): AMMONIA in the last 168 hours.  CBC: Recent Labs  Lab 02/04/21 0454 02/05/21 0610 02/06/21 0325 02/07/21 0420  WBC 7.5 9.2 10.8* 8.8  HGB 8.9* 8.7* 9.0* 9.6*  HCT 29.0* 27.9* 28.6* 30.2*  MCV 96.7 95.5 96.3 94.4  PLT 322 283 301 297    Cardiac Enzymes: No results for input(s): CKTOTAL, CKMB, CKMBINDEX,  TROPONINI in the last 168 hours.  BNP (last 3 results) No results for input(s): BNP in the last 8760 hours.  ProBNP (last 3 results) No results for input(s): PROBNP in the last 8760 hours.  Radiological Exams: No results found.  Assessment/Plan Active Problems:   Acute on chronic respiratory failure with hypoxia (HCC)   Acute ischemic right MCA stroke (HCC)   C5 cervical fracture (HCC)   Pneumothorax   Healthcare-associated pneumonia   Acute on chronic respiratory failure with hypoxia plan is going to be to continue with T collar weaning.  Patient still has copious secretions.  Continue pulmonary toilet. Acute stroke no change supportive care we will continue to monitor. C5 cervical fracture we will continue to follow along closely. Pneumothorax resolved Healthcare associated pneumonia treated   I have personally seen and evaluated the patient, evaluated laboratory and imaging results, formulated the assessment and plan and placed orders. The Patient requires high complexity decision making with multiple systems involvement.  Rounds were done with the Respiratory Therapy Director and Staff therapists and discussed with nursing staff also.  04/09/21, MD [facility] Pulmonary Critical Care Medicine Sleep Medicine

## 2021-02-09 ENCOUNTER — Inpatient Hospital Stay: Admit: 2021-02-09 | Payer: Medicare Other | Admitting: Otolaryngology

## 2021-02-09 ENCOUNTER — Observation Stay (HOSPITAL_COMMUNITY): Payer: No Typology Code available for payment source

## 2021-02-09 ENCOUNTER — Encounter (HOSPITAL_COMMUNITY)
Admission: AD | Disposition: A | Discharge status: Home / Self Care | Payer: Self-pay | Source: Other Acute Inpatient Hospital | Attending: Internal Medicine

## 2021-02-09 ENCOUNTER — Encounter (HOSPITAL_COMMUNITY): Payer: Self-pay | Admitting: Anesthesiology

## 2021-02-09 ENCOUNTER — Observation Stay (HOSPITAL_COMMUNITY)
Admission: AD | Admit: 2021-02-09 | Discharge: 2021-02-10 | Disposition: A | Discharge status: Home / Self Care | Payer: No Typology Code available for payment source | Source: Other Acute Inpatient Hospital | Attending: Internal Medicine | Admitting: Internal Medicine

## 2021-02-09 ENCOUNTER — Encounter: Admission: AD | Disposition: A | Discharge status: Still In Hospital | Payer: Self-pay | Attending: Internal Medicine

## 2021-02-09 ENCOUNTER — Ambulatory Visit (INDEPENDENT_AMBULATORY_CARE_PROVIDER_SITE_OTHER): Payer: Self-pay | Admitting: Otolaryngology

## 2021-02-09 DIAGNOSIS — R0902 Hypoxemia: Secondary | ICD-10-CM

## 2021-02-09 DIAGNOSIS — I5030 Unspecified diastolic (congestive) heart failure: Secondary | ICD-10-CM | POA: Diagnosis not present

## 2021-02-09 DIAGNOSIS — Z79899 Other long term (current) drug therapy: Secondary | ICD-10-CM | POA: Insufficient documentation

## 2021-02-09 DIAGNOSIS — I110 Hypertensive heart disease with heart failure: Secondary | ICD-10-CM | POA: Diagnosis not present

## 2021-02-09 DIAGNOSIS — J9601 Acute respiratory failure with hypoxia: Secondary | ICD-10-CM

## 2021-02-09 DIAGNOSIS — J9621 Acute and chronic respiratory failure with hypoxia: Secondary | ICD-10-CM | POA: Insufficient documentation

## 2021-02-09 DIAGNOSIS — Z85818 Personal history of malignant neoplasm of other sites of lip, oral cavity, and pharynx: Secondary | ICD-10-CM | POA: Diagnosis not present

## 2021-02-09 DIAGNOSIS — Z87891 Personal history of nicotine dependence: Secondary | ICD-10-CM | POA: Insufficient documentation

## 2021-02-09 DIAGNOSIS — J9501 Hemorrhage from tracheostomy stoma: Principal | ICD-10-CM | POA: Insufficient documentation

## 2021-02-09 DIAGNOSIS — E119 Type 2 diabetes mellitus without complications: Secondary | ICD-10-CM | POA: Insufficient documentation

## 2021-02-09 DIAGNOSIS — J189 Pneumonia, unspecified organism: Secondary | ICD-10-CM | POA: Diagnosis not present

## 2021-02-09 DIAGNOSIS — J9690 Respiratory failure, unspecified, unspecified whether with hypoxia or hypercapnia: Secondary | ICD-10-CM

## 2021-02-09 DIAGNOSIS — I63511 Cerebral infarction due to unspecified occlusion or stenosis of right middle cerebral artery: Secondary | ICD-10-CM | POA: Diagnosis not present

## 2021-02-09 DIAGNOSIS — J939 Pneumothorax, unspecified: Secondary | ICD-10-CM | POA: Diagnosis not present

## 2021-02-09 HISTORY — PX: TRACHEOSTOMY TUBE PLACEMENT: SHX814

## 2021-02-09 HISTORY — PX: CRYOTHERAPY: SHX6894

## 2021-02-09 HISTORY — PX: BRONCHIAL WASHINGS: SHX5105

## 2021-02-09 HISTORY — PX: VIDEO BRONCHOSCOPY: SHX5072

## 2021-02-09 LAB — RENAL FUNCTION PANEL
Albumin: 1.4 g/dL — ABNORMAL LOW (ref 3.5–5.0)
Anion gap: 8 (ref 5–15)
BUN: 54 mg/dL — ABNORMAL HIGH (ref 8–23)
CO2: 29 mmol/L (ref 22–32)
Calcium: 8.3 mg/dL — ABNORMAL LOW (ref 8.9–10.3)
Chloride: 106 mmol/L (ref 98–111)
Creatinine, Ser: 0.6 mg/dL — ABNORMAL LOW (ref 0.61–1.24)
GFR, Estimated: >60 mL/min (ref >60)
Glucose, Bld: 94 mg/dL (ref 70–99)
Phosphorus: 3.1 mg/dL (ref 2.5–4.6)
Potassium: 4 mmol/L (ref 3.5–5.1)
Sodium: 143 mmol/L (ref 135–145)

## 2021-02-09 LAB — CBC
HCT: 31.1 % — ABNORMAL LOW (ref 39.0–52.0)
HCT: 29.4 % — ABNORMAL LOW (ref 39.0–52.0)
HCT: 27.1 % — ABNORMAL LOW (ref 39.0–52.0)
Hemoglobin: 9.3 g/dL — ABNORMAL LOW (ref 13.0–17.0)
Hemoglobin: 9.1 g/dL — ABNORMAL LOW (ref 13.0–17.0)
Hemoglobin: 8.2 g/dL — ABNORMAL LOW (ref 13.0–17.0)
MCH: 29.3 pg (ref 26.0–34.0)
MCH: 29.9 pg (ref 26.0–34.0)
MCH: 29.5 pg (ref 26.0–34.0)
MCHC: 29.9 g/dL — ABNORMAL LOW (ref 30.0–36.0)
MCHC: 31 g/dL (ref 30.0–36.0)
MCHC: 30.3 g/dL (ref 30.0–36.0)
MCV: 98.1 fL (ref 80.0–100.0)
MCV: 96.7 fL (ref 80.0–100.0)
MCV: 97.5 fL (ref 80.0–100.0)
Platelets: 290 10*3/uL (ref 150–400)
Platelets: 297 10*3/uL (ref 150–400)
Platelets: 271 10*3/uL (ref 150–400)
RBC: 3.17 MIL/uL — ABNORMAL LOW (ref 4.22–5.81)
RBC: 3.04 MIL/uL — ABNORMAL LOW (ref 4.22–5.81)
RBC: 2.78 MIL/uL — ABNORMAL LOW (ref 4.22–5.81)
RDW: 15.5 % (ref 11.5–15.5)
RDW: 15.4 % (ref 11.5–15.5)
RDW: 15.3 % (ref 11.5–15.5)
WBC: 9.5 10*3/uL (ref 4.0–10.5)
WBC: 11.4 10*3/uL — ABNORMAL HIGH (ref 4.0–10.5)
WBC: 13.8 10*3/uL — ABNORMAL HIGH (ref 4.0–10.5)
nRBC: 0 % (ref 0.0–0.2)
nRBC: 0 % (ref 0.0–0.2)
nRBC: 0 % (ref 0.0–0.2)

## 2021-02-09 LAB — GLUCOSE, CAPILLARY
Glucose-Capillary: 89 mg/dL (ref 70–99)
Glucose-Capillary: 84 mg/dL (ref 70–99)
Glucose-Capillary: 103 mg/dL — ABNORMAL HIGH (ref 70–99)

## 2021-02-09 LAB — POCT I-STAT 7, (LYTES, BLD GAS, ICA,H+H)
Acid-Base Excess: 11 mmol/L — ABNORMAL HIGH (ref 0.0–2.0)
Bicarbonate: 35 mmol/L — ABNORMAL HIGH (ref 20.0–28.0)
Calcium, Ion: 1.13 mmol/L — ABNORMAL LOW (ref 1.15–1.40)
HCT: 22 % — ABNORMAL LOW (ref 39.0–52.0)
Hemoglobin: 7.5 g/dL — ABNORMAL LOW (ref 13.0–17.0)
O2 Saturation: 100 %
Patient temperature: 98.4
Potassium: 3.7 mmol/L (ref 3.5–5.1)
Sodium: 146 mmol/L — ABNORMAL HIGH (ref 135–145)
TCO2: 36 mmol/L — ABNORMAL HIGH (ref 22–32)
pCO2 arterial: 46.2 mmHg (ref 32.0–48.0)
pH, Arterial: 7.487 — ABNORMAL HIGH (ref 7.350–7.450)
pO2, Arterial: 332 mmHg — ABNORMAL HIGH (ref 83.0–108.0)

## 2021-02-09 LAB — PROTIME-INR
INR: 1.3 — ABNORMAL HIGH (ref 0.8–1.2)
Prothrombin Time: 16.1 seconds — ABNORMAL HIGH (ref 11.4–15.2)

## 2021-02-09 LAB — MRSA NEXT GEN BY PCR, NASAL: MRSA by PCR Next Gen: NOT DETECTED

## 2021-02-09 LAB — TYPE AND SCREEN
ABO/RH(D): O NEG
Antibody Screen: NEGATIVE

## 2021-02-09 LAB — MAGNESIUM: Magnesium: 1.8 mg/dL (ref 1.7–2.4)

## 2021-02-09 SURGERY — CREATION, TRACHEOSTOMY
Anesthesia: General

## 2021-02-09 SURGERY — VIDEO BRONCHOSCOPY WITHOUT FLUORO
Anesthesia: Choice | Laterality: Bilateral

## 2021-02-09 MED ORDER — ORAL CARE MOUTH RINSE
15.0000 mL | OROMUCOSAL | Status: DC
  Administered 2021-02-09 – 2021-02-10 (×6): 15 mL via OROMUCOSAL

## 2021-02-09 MED ORDER — PHENYLEPHRINE HCL-NACL 20-0.9 MG/250ML-% IV SOLN: 0.0000 ug/min | INTRAVENOUS | Status: DC

## 2021-02-09 MED ORDER — FENTANYL CITRATE (PF) 100 MCG/2ML IJ SOLN: 25.0000 ug | INTRAMUSCULAR | Status: DC | PRN

## 2021-02-09 MED ORDER — PROSOURCE TF PO LIQD
45.0000 mL | Freq: Two times a day (BID) | ORAL | Status: DC
  Administered 2021-02-09 – 2021-02-10 (×2): 45 mL
  Filled 2021-02-09 (×2): qty 45

## 2021-02-09 MED ORDER — ALBUMIN HUMAN 25 % IV SOLN
25.0000 g | Freq: Four times a day (QID) | INTRAVENOUS | Status: DC
Start: 2021-02-09 — End: 2021-02-10
  Administered 2021-02-09 – 2021-02-10 (×3): 25 g via INTRAVENOUS
  Filled 2021-02-09 (×3): qty 100

## 2021-02-09 MED ORDER — PHENYLEPHRINE 40 MCG/ML (10ML) SYRINGE FOR IV PUSH (FOR BLOOD PRESSURE SUPPORT)
100.0000 ug | PREFILLED_SYRINGE | Freq: Once | INTRAVENOUS | Status: AC | PRN
  Administered 2021-02-09: 100 ug via INTRAVENOUS

## 2021-02-09 MED ORDER — MIDODRINE HCL 5 MG PO TABS
2.5000 mg | ORAL_TABLET | Freq: Four times a day (QID) | ORAL | Status: DC
  Administered 2021-02-09: 2.5 mg via ORAL
  Filled 2021-02-09 (×2): qty 1

## 2021-02-09 MED ORDER — CHLORHEXIDINE GLUCONATE 0.12% ORAL RINSE (MEDLINE KIT)
15.0000 mL | Freq: Two times a day (BID) | OROMUCOSAL | Status: DC
  Administered 2021-02-09: 15 mL via OROMUCOSAL

## 2021-02-09 MED ORDER — FENTANYL CITRATE (PF) 100 MCG/2ML IJ SOLN
INTRAMUSCULAR | Status: AC
  Filled 2021-02-09: qty 2

## 2021-02-09 MED ORDER — BUDESONIDE 0.5 MG/2ML IN SUSP
0.5000 mg | Freq: Two times a day (BID) | RESPIRATORY_TRACT | Status: DC
  Administered 2021-02-09 – 2021-02-10 (×2): 0.5 mg via RESPIRATORY_TRACT
  Filled 2021-02-09 (×2): qty 2

## 2021-02-09 MED ORDER — INSULIN ASPART 100 UNIT/ML IJ SOLN: 0.0000 [IU] | INTRAMUSCULAR | Status: DC

## 2021-02-09 MED ORDER — 0.9 % SODIUM CHLORIDE (POUR BTL) OPTIME: TOPICAL | Status: DC | PRN

## 2021-02-09 MED ORDER — LACTATED RINGERS IV BOLUS
500.0000 mL | Freq: Once | INTRAVENOUS | Status: AC
  Administered 2021-02-09: 500 mL via INTRAVENOUS

## 2021-02-09 MED ORDER — TRANEXAMIC ACID FOR INHALATION
500.0000 mg | Freq: Three times a day (TID) | RESPIRATORY_TRACT | Status: DC
  Filled 2021-02-09 (×2): qty 10

## 2021-02-09 MED ORDER — TRANEXAMIC ACID FOR INHALATION
500.0000 mg | Freq: Three times a day (TID) | RESPIRATORY_TRACT | Status: AC
  Administered 2021-02-09 – 2021-02-10 (×3): 500 mg via RESPIRATORY_TRACT
  Filled 2021-02-09 (×3): qty 10

## 2021-02-09 MED ORDER — MIDAZOLAM HCL 2 MG/2ML IJ SOLN
INTRAMUSCULAR | Status: AC
  Filled 2021-02-09: qty 2

## 2021-02-09 MED ORDER — PHENYLEPHRINE 40 MCG/ML (10ML) SYRINGE FOR IV PUSH (FOR BLOOD PRESSURE SUPPORT)
50.0000 ug | PREFILLED_SYRINGE | Freq: Once | INTRAVENOUS | Status: AC | PRN
  Administered 2021-02-09: 50 ug via INTRAVENOUS

## 2021-02-09 MED ORDER — VITAL HIGH PROTEIN PO LIQD
1000.0000 mL | ORAL | Status: DC
  Administered 2021-02-09: 1000 mL

## 2021-02-09 MED ORDER — PROPOFOL 10 MG/ML IV BOLUS
INTRAVENOUS | Status: AC
  Filled 2021-02-09: qty 20

## 2021-02-09 MED ORDER — ETOMIDATE 2 MG/ML IV SOLN
20.0000 mg | Freq: Once | INTRAVENOUS | Status: AC
  Administered 2021-02-09: 20 mg via INTRAVENOUS

## 2021-02-09 MED ORDER — VASOPRESSIN 20 UNIT/ML IV SOLN
INTRAVENOUS | Status: AC
  Filled 2021-02-09: qty 1

## 2021-02-09 MED ORDER — PANTOPRAZOLE SODIUM 40 MG IV SOLR
40.0000 mg | Freq: Two times a day (BID) | INTRAVENOUS | Status: DC
  Administered 2021-02-09 – 2021-02-10 (×2): 40 mg via INTRAVENOUS
  Filled 2021-02-09 (×2): qty 40

## 2021-02-09 MED ORDER — STERILE WATER FOR IRRIGATION IR SOLN
Status: DC | PRN
  Administered 2021-02-09: 1000 mL

## 2021-02-09 MED ORDER — IPRATROPIUM-ALBUTEROL 0.5-2.5 (3) MG/3ML IN SOLN: 3.0000 mL | RESPIRATORY_TRACT | Status: DC | PRN

## 2021-02-09 MED ORDER — THIAMINE HCL 100 MG PO TABS
100.0000 mg | ORAL_TABLET | Freq: Every day | ORAL | Status: DC
  Administered 2021-02-10: 100 mg
  Filled 2021-02-09: qty 1

## 2021-02-09 MED ORDER — PHENYLEPHRINE HCL-NACL 20-0.9 MG/250ML-% IV SOLN: 25.0000 ug/min | INTRAVENOUS | Status: DC

## 2021-02-09 MED ORDER — POLYETHYLENE GLYCOL 3350 17 G PO PACK
17.0000 g | PACK | Freq: Every day | ORAL | Status: DC
  Administered 2021-02-10: 17 g
  Filled 2021-02-09: qty 1

## 2021-02-09 MED ORDER — ROCURONIUM BROMIDE 50 MG/5ML IV SOLN
50.0000 mg | Freq: Once | INTRAVENOUS | Status: AC
  Administered 2021-02-09: 50 mg via INTRAVENOUS
  Filled 2021-02-09: qty 5

## 2021-02-09 MED ORDER — SODIUM CHLORIDE 0.9 % IV SOLN
250.0000 mL | INTRAVENOUS | Status: DC
  Administered 2021-02-10: 250 mL via INTRAVENOUS

## 2021-02-09 MED ORDER — DOCUSATE SODIUM 50 MG/5ML PO LIQD
100.0000 mg | Freq: Two times a day (BID) | ORAL | Status: DC
  Administered 2021-02-09 – 2021-02-10 (×2): 100 mg
  Filled 2021-02-09 (×2): qty 10

## 2021-02-09 SURGICAL SUPPLY — 46 items
ATTRACTOMAT 16X20 MAGNETIC DRP (DRAPES) IMPLANT
BAG COUNTER SPONGE SURGICOUNT (BAG) ×2 IMPLANT
BAG SPNG CNTER NS LX DISP (BAG) ×1
BLADE SURG 15 STRL LF DISP TIS (BLADE) ×1 IMPLANT
BLADE SURG 15 STRL SS (BLADE) ×2
CLEANER TIP ELECTROSURG 2X2 (MISCELLANEOUS) ×2 IMPLANT
COAGULATOR SUCT SWTCH 10FR 6 (ELECTROSURGICAL) ×2 IMPLANT
COVER SURGICAL LIGHT HANDLE (MISCELLANEOUS) ×2 IMPLANT
DRAPE HALF SHEET 40X57 (DRAPES) IMPLANT
DRSG MEPILEX BORD LITE 2X5 (GAUZE/BANDAGES/DRESSINGS) ×2 IMPLANT
ELECT COATED BLADE 2.86 ST (ELECTRODE) ×2 IMPLANT
ELECT REM PT RETURN 9FT ADLT (ELECTROSURGICAL) ×2
ELECTRODE REM PT RTRN 9FT ADLT (ELECTROSURGICAL) ×1 IMPLANT
GAUZE 4X4 16PLY ~~LOC~~+RFID DBL (SPONGE) ×4 IMPLANT
GEL ULTRASOUND 20GR AQUASONIC (MISCELLANEOUS) ×2 IMPLANT
GLOVE SURG MICRO LTX SZ7.5 (GLOVE) ×2 IMPLANT
GOWN STRL REUS W/ TWL LRG LVL3 (GOWN DISPOSABLE) ×1 IMPLANT
GOWN STRL REUS W/ TWL XL LVL3 (GOWN DISPOSABLE) ×1 IMPLANT
GOWN STRL REUS W/TWL LRG LVL3 (GOWN DISPOSABLE) ×2
GOWN STRL REUS W/TWL XL LVL3 (GOWN DISPOSABLE) ×2
HEMOSTAT SNOW SURGICEL 2X4 (HEMOSTASIS) ×2 IMPLANT
HOLDER TRACH TUBE VELCRO 19.5 (MISCELLANEOUS) ×4 IMPLANT
KIT BASIN OR (CUSTOM PROCEDURE TRAY) ×2 IMPLANT
KIT SUCTION CATH 14FR (SUCTIONS) ×2 IMPLANT
KIT TURNOVER KIT B (KITS) ×2 IMPLANT
NEEDLE HYPO 25GX1X1/2 BEV (NEEDLE) ×2 IMPLANT
NS IRRIG 1000ML POUR BTL (IV SOLUTION) ×2 IMPLANT
PACK EENT II TURBAN DRAPE (CUSTOM PROCEDURE TRAY) ×2 IMPLANT
PAD ARMBOARD 7.5X6 YLW CONV (MISCELLANEOUS) IMPLANT
PENCIL BUTTON HOLSTER BLD 10FT (ELECTRODE) ×2 IMPLANT
SPONGE DRAIN TRACH 4X4 STRL 2S (GAUZE/BANDAGES/DRESSINGS) ×2 IMPLANT
SPONGE INTESTINAL PEANUT (DISPOSABLE) ×2 IMPLANT
SUT SILK 2 0 SH CR/8 (SUTURE) ×2 IMPLANT
SUT SILK 3 0 TIES 10X30 (SUTURE) IMPLANT
SYR 10ML LL (SYRINGE) ×2 IMPLANT
SYR 20ML LL LF (SYRINGE) ×2 IMPLANT
SYR 5ML LUER SLIP (SYRINGE) ×2 IMPLANT
SYR CONTROL 10ML LL (SYRINGE) ×2 IMPLANT
TOWEL GREEN STERILE (TOWEL DISPOSABLE) ×2 IMPLANT
TOWEL GREEN STERILE FF (TOWEL DISPOSABLE) ×2 IMPLANT
TUBE CONNECTING 12X1/4 (SUCTIONS) ×4 IMPLANT
TUBE CONNECTING 20X1/4 (TUBING) ×2 IMPLANT
TUBE TRACH 8.0 EXL PROX  CUF (TUBING) ×1
TUBE TRACH 8.0 EXL PROX CUF (TUBING) ×1 IMPLANT
TUBE TRACH SHILEY  6 DIST  CUF (TUBING) IMPLANT
TUBE TRACH SHILEY 8 DIST CUF (TUBING) IMPLANT

## 2021-02-09 NOTE — Progress Notes (Signed)
Pulmonary Critical Care Medicine [facility] [place]   PULMONARY CRITICAL CARE SERVICE  PROGRESS NOTE     Julian Johnson  [id number]  DOB: [birth year]   DOA: 01/18/2021  Referring Physician: Carron Curie, MD  HPI: Julian Johnson is a 80 y.o. male being followed for ventilator/airway/oxygen weaning Acute on Chronic Respiratory Failure.  Patient is on trach collar currently on 40% FiO2  Medications: Reviewed on Rounds  Physical Exam:  Vitals: Temperature is 98.0 pulse 79 respiratory rate is 18 blood pressure is 136/74 saturations 97%  Ventilator Settings on T collar FiO2 40% patient is currently on goal of 20 hours  General: Comfortable at this time Neck: supple Cardiovascular: no malignant arrhythmias Respiratory: No rhonchi very coarse breath sounds Skin: no rash seen on limited exam Musculoskeletal: No gross abnormality Psychiatric:unable to assess Neurologic:no involuntary movements         Lab Data:   Basic Metabolic Panel: Recent Labs  Lab 02/04/21 0454 02/05/21 0610 02/07/21 0420  NA 138 139 140  K 4.4 4.7 4.3  CL 106 107 105  CO2 27 25 27   GLUCOSE 129* 190* 189*  BUN 70* 66* 53*  CREATININE 0.76 0.79 0.69  CALCIUM 8.6* 8.4* 8.4*  MG 2.2 2.0 2.0  PHOS 3.7 3.3 2.9    ABG: No results for input(s): PHART, PCO2ART, PO2ART, HCO3, O2SAT in the last 168 hours.  Liver Function Tests: No results for input(s): AST, ALT, ALKPHOS, BILITOT, PROT, ALBUMIN in the last 168 hours. No results for input(s): LIPASE, AMYLASE in the last 168 hours. No results for input(s): AMMONIA in the last 168 hours.  CBC: Recent Labs  Lab 02/04/21 0454 02/05/21 0610 02/06/21 0325 02/07/21 0420  WBC 7.5 9.2 10.8* 8.8  HGB 8.9* 8.7* 9.0* 9.6*  HCT 29.0* 27.9* 28.6* 30.2*  MCV 96.7 95.5 96.3 94.4  PLT 322 283 301 297    Cardiac Enzymes: No results for input(s): CKTOTAL, CKMB, CKMBINDEX, TROPONINI in the last 168 hours.  BNP (last 3 results) No results for  input(s): BNP in the last 8760 hours.  ProBNP (last 3 results) No results for input(s): PROBNP in the last 8760 hours.  Radiological Exams: DG CHEST PORT 1 VIEW  Result Date: 02/08/2021 CLINICAL DATA:  Respiratory failure EXAM: PORTABLE CHEST 1 VIEW COMPARISON:  02/06/2021 FINDINGS: Tracheostomy tube remains in place. Vascular stent at the right neck is again seen. Stable heart size. Atherosclerotic calcification of the aortic knob. Persistent bibasilar airspace opacities, left worse than right. Aeration of the left lung base has slightly improved from prior. Suspect small left pleural effusion. No pneumothorax. IMPRESSION: Persistent bibasilar airspace opacities, left worse than right. Aeration of the left lung base has slightly improved from prior. Electronically Signed   By: 04/08/2021 D.O.   On: 02/08/2021 10:22    Assessment/Plan Active Problems:   Acute on chronic respiratory failure with hypoxia (HCC)   Acute ischemic right MCA stroke (HCC)   C5 cervical fracture (HCC)   Pneumothorax   Healthcare-associated pneumonia   Acute on chronic respiratory failure with hypoxia patient is doing well with the weaning however has been having some issues with bleeding from the tracheostomy.  It appears that the neck collar is pressing on the trach and causing some irritation.  Discussed with the primary care team regarding using silver nitrate if possible to try to medically cauterize any bleeding sites Acute stroke no change we will continue with present management supportive care. C5 cervical fracture patient is at baseline we  will continue to follow along closely. Pneumothorax no change continue with present management Healthcare associated pneumonia treated improving clinically last chest x-ray also showed improvement at the bases   I have personally seen and evaluated the patient, evaluated laboratory and imaging results, formulated the assessment and plan and placed orders. The  Patient requires high complexity decision making with multiple systems involvement.  Rounds were done with the Respiratory Therapy Director and Staff therapists and discussed with nursing staff also.  Yevonne Pax, MD [facility] Pulmonary Critical Care Medicine Sleep Medicine

## 2021-02-09 NOTE — H&P (Signed)
PREOPERATIVE H&P  Chief Complaint: Bleeding around tracheostomy  HPI: Julian Johnson is a 80 y.o. male who presents for evaluation of bleeding around tracheostomy which was performed 5 days ago.  Patient was recently started back on a blood thinner.  He has been having bleeding since this morning for several hours despite packing.  He is taken to the operating room at this time for control of bleeding around the tracheostomy site.  No past medical history on file. Past Surgical History:  Procedure Laterality Date   TRACHEOSTOMY TUBE PLACEMENT N/A 02/04/2021   Procedure: TRACHEOSTOMY;  Surgeon: Drema Halon, MD;  Location: [facility] OR;  Service: ENT;  Laterality: N/A;   Social History   Socioeconomic History   Marital status: Married    Spouse name: Not on file   Number of children: Not on file   Years of education: Not on file   Highest education level: Not on file  Occupational History   Not on file  Tobacco Use   Smoking status: Not on file   Smokeless tobacco: Not on file  Substance and Sexual Activity   Alcohol use: Not on file   Drug use: Not on file   Sexual activity: Not on file  Other Topics Concern   Not on file  Social History Narrative   Not on file   Social Determinants of Health   Financial Resource Strain: Not on file  Food Insecurity: Not on file  Transportation Needs: Not on file  Physical Activity: Not on file  Stress: Not on file  Social Connections: Not on file   No family history on file. Not on File Prior to Admission medications   Not on File     Positive ROS: Otherwise negative  All other systems have been reviewed and were otherwise negative with the exception of those mentioned in the HPI and as above.  Physical Exam: There were no vitals filed for this visit.  General: Tracheostomy in place on ventilator and not responsive. Nasal: Clear nasal passages Neck: No palpable adenopathy or thyroid nodules.  Patient with tracheostomy in  place with some packing around the tracheostomy and some bright red blood around the packing.  C-collar in place. Cardiovascular: Regular rate and rhythm, no murmur.  Respiratory: Clear to auscultation Neurologic: Alert and oriented x 3   Assessment/Plan: Postop bleeding status post tracheostomy 5 days ago Plan for control of bleeding around tracheostomy.   Dillard Cannon, MD 02/09/2021 12:50 PM

## 2021-02-09 NOTE — Procedures (Signed)
Bronchoscopy Procedure Note  Julian Johnson  [id number]  [birth year]  Date:02/09/21  Time:4:27 PM   Provider Performing:Oceane Fosse C Tamala Julian   Procedure(s):  Flexible bronchoscopy with bronchial alveolar lavage (56153), Initial Therapeutic Aspiration of Tracheobronchial Tree [phone]), and [phone]    Indication(s) Occluded trachea  Consent Unable to obtain consent due to emergent nature of procedure.  Anesthesia Etomidate/roc   Time Out Verified patient identification, verified procedure, site/side was marked, verified correct patient position, special equipment/implants available, medications/allergies/relevant history reviewed, required imaging and test results available.   Sterile Technique Usual hand hygiene, masks, gowns, and gloves were used   Procedure Description Bronchoscope advanced through tracheostomy tube and into airway.  Airways were examined down to subsegmental level with findings noted below.    Findings:  - Trach in good position - Blood clot occluding RML suctioned - Clot occluding trachea down through left mainstem into lower lobe all suctioned down to segmental level and removed via suction and en bloc with cryoprobe - No active bleeding, likely came from above   Complications/Tolerance None; patient tolerated the procedure well. Chest X-ray is needed post procedure.   EBL 200cc   Specimen(s) None

## 2021-02-09 NOTE — H&P (Signed)
NAME:  Julian Johnson, MRN:  [id number], DOB:  Dec 16, 1940, LOS: 1 ADMISSION DATE:  02/09/2021, CONSULTATION DATE:  02/09/2021 REFERRING MD:  [facility], CHIEF COMPLAINT:  hypoxia   History of Present Illness:  HPI obtained from medical chart review as patient has tracheostomy and on mechanical ventilation.  80 year old male with prior history of HTN, DMT2, oropharyngeal cancer with dysphagia/ PEG tube, CVA, PAD, hearing loss, carotid stenosis, pulmonary fibrosis, and aspiration pneumonia.  Patient had right carotid stent placed on 6/1 secondary to known carotid stenosis.  He was then was hospitalized at [facility] 6/17- 7/19 after falling out of bed, found to have left hemiparesis, anterior inferior fracture of C5 vertebrae with prevertebral edema- requiring c-collar, and multiple right MCA/ ACA infarcts with right ICA stenosis. Had repeated right ICA stent and angioplasty on 7/11.  Hospitalization complicated by multiple hypoxic episodes thought related to recurrent aspiration, pseudomonas pna, left pneumothorax, and ABLA with transfusions.  PEG placed 7/11.  He was transferred to [facility] on [facility], endotracheally intubated on 7/19.  At [facility], he underwent tracheostomy on 8/5.  He was on brilinta and eliquis which were held.  Brilinta was restarted 2 days post-op, however, patient started to have bleeding complications on 8/10 am despite packing, therefore was taken back to the OR with Dr. Ezzard Standing with ENT for trach revision, trach changed from #6 to #8.  Noted to be on FiO2 40% with saturations of 97% prior to.  Procedure complicated by further bleeding which was cauterized however patient aspirated blood with subsequent hyopxia and sats in the 70's.  PCCM called emergently for transfer as aspirated blood not able to be removed with bedside bronch.    Pertinent  Medical History  HTN, HFpEF, anemia/ thrombocytopenia, DMT2, oropharyngeal cancer with dysphagia/ PEG tube,  CVA, PAD, hearing loss, carotid stenosis, pulmonary fibrosis, aspiration pneumonia, PAD, chronic fatigue, GERD, tobacco abuse, failure to thrive, osteoporosis, depression  Significant Hospital Events: Including procedures, antibiotic start and stop dates in addition to other pertinent events   Admitted to [facility] 7/19 Trach ENT 8/5 Bleeding around trach-> to OR for trach revision, aspirated blood, hypoxic-> transferred emergently to [facility] ICU for bronch  Interim History / Subjective:  Arrived being bagged.  BP stable Placed on vent- PEEP increased to 12/ FiO2 remains 1.0 with slight improvement in sats up to 89%  Objective   Blood pressure 116/62, pulse 84, resp. rate (!) 22, SpO2 (!) 86 %.    Vent Mode: PRVC FiO2 (%):  [100 %] 100 % Set Rate:  [22 bmp] 22 bmp Vt Set:  [530 mL] 530 mL PEEP:  [12 cmH20] 12 cmH20 Plateau Pressure:  [33 cmH20] 33 cmH20  No intake or output data in the 24 hours ending 02/09/21 1643 There were no vitals filed for this visit.  Examination: General:  chronically ill appearing elderly male  HEENT: MM gray/moist, midline trach #8, no active bleeding, c-collar in place Neuro: currently sedated and paralyzed. Reportedly moving RUE, not following commands (very HOH) CV: NSR, no murmur PULM:  MV supported breaths, exp wheeze on right, diminished on left GI: soft, scaphoid abd, +bs, peg tube clamped LUQ, foley- amber urine Extremities: warm/dry, LUE edema, SL picc in LUE site wnl, no LE edema Skin:  posterior not accessed given emergent nature, mepliex dressing to B heels  Resolved Hospital Problem list    Assessment & Plan:   Acute on chronic hypoxic respiratory failure secondary to aspirated blood products from  bleeding trach earlier today - emergent direct admit to [facility] 55M ICU from [facility] for emergent bronch - see Dr. Emilie Rutter note - txa nebs 500mg  TID x 3 doses - Continue MV support, 8cc/kg IBW with goal Pplat <30 and DP<15  - VAP prevention  protocol/ PPI - PAD protocol for sedation> prn fentanyl, RASS goal 0/-1 - wean FiO2/ PEEP as able for SpO2 >92%  - daily SAT & SBT - CXR pending post bronch  - duonebs/ Pulmicort   - likely can resume Brilinta 8/12 (per recs of ENT)  if no further bleeding complications and transfer back to [facility] 8/11   Post procedure hypotension- likely related to sedation/ paralytics.   - LR bolus now, may need short term Neo gtt - goal MAP > 65 - pending CXR (saturations remain stable) - recheck stat CBC with T&S   Tracheostomy 8/5 with bleeding s/p trach revision 8/10 - per ENT.  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 10/12 ]  Labs   CBC: Recent Labs  Lab 02/04/21 0454 02/05/21 0610 02/06/21 0325 02/07/21 0420 02/09/21 1126  WBC 7.5 9.2 10.8* 8.8 9.5  HGB 8.9* 8.7* 9.0* 9.6* 9.3*  HCT 29.0* 27.9* 28.6* 30.2* 31.1*  MCV 96.7 95.5 96.3 94.4 98.1  PLT 322 283 301 297 290    Basic Metabolic Panel: Recent Labs  Lab 02/04/21 0454 02/05/21 0610 02/07/21 0420  NA 138 139 140  K 4.4 4.7 4.3  CL 106 107 105  CO2 27 25 27   GLUCOSE 129* 190* 189*  BUN 70* 66* 53*  CREATININE 0.76 0.79  0.69  CALCIUM 8.6* 8.4* 8.4*  MG 2.2 2.0 2.0  PHOS 3.7 3.3 2.9   GFR: CrCl cannot be calculated (Unknown ideal weight.). Recent Labs  Lab 02/05/21 0610 02/06/21 0325 02/07/21 0420 02/09/21 1126  WBC 9.2 10.8* 8.8 9.5    Liver Function Tests: No results for input(s): AST, ALT, ALKPHOS, BILITOT, PROT, ALBUMIN in the last 168 hours. No results for input(s): LIPASE, AMYLASE in the last 168 hours. No results for input(s): AMMONIA in the last 168 hours.  ABG    Component Value Date/Time   PHART 7.474 (H) 01/18/2021 1350   PCO2ART 36.1 01/18/2021 1350   PO2ART 73.1 (L) 01/18/2021 1350   HCO3 26.2 01/18/2021 1350   O2SAT 95.5 01/18/2021 1350     Coagulation Profile: Recent Labs  Lab 02/04/21 0454 02/09/21 1126  INR 1.3* 1.3*    Cardiac Enzymes: No results for input(s): CKTOTAL, CKMB, CKMBINDEX, TROPONINI in the last 168 hours.  HbA1C: Hgb A1c MFr Bld  Date/Time Value Ref Range Status  01/22/2021 06:46 AM 5.7 (H) 4.8 - 5.6 % Final    Comment:    (NOTE)         Prediabetes: 5.7 - 6.4         Diabetes: >6.4         Glycemic control for adults with diabetes: <7.0   01/19/2021 03:58 AM 5.9 (H) 4.8 - 5.6 % Final    Comment:    (NOTE) Pre diabetes:          5.7%-6.4%  Diabetes:              >6.4%  Glycemic control for   <7.0% adults with diabetes     CBG: No results for input(s): GLUCAP in the last 168 hours.  Review of Systems:   Unable   Past Medical History:  He,  has no past medical history on file.   Surgical History:   Past Surgical History:  Procedure Laterality Date   TRACHEOSTOMY TUBE PLACEMENT N/A 02/04/2021   Procedure: TRACHEOSTOMY;  Surgeon: Drema Halon, MD;  Location: [facility] OR;  Service: ENT;  Laterality: N/A;     Social History:    Unable  Family History:  His family history is not on file.   Allergies Not on File   Home Medications  Prior to Admission medications   Not on File     Critical care time: 60 mins      Posey Boyer, ACNP [facility] 02/09/2021, 5:21 PM  See Amion for pager If no response to pager, please call PCCM consult pager After 7:00 pm call Elink

## 2021-02-09 NOTE — Transfer of Care (Signed)
Immediate Anesthesia Transfer of Care Note  Patient: Julian Johnson  Procedure(s) Performed: REVISION TRACHEOSTOMY  Patient Location: [facility]  Anesthesia Type:General  Level of Consciousness: drowsy  Airway & Oxygen Therapy: Patient Spontanous Breathing  Post-op Assessment: Report given to RN  Post vital signs: Reviewed and stable  Last Vitals:  Vitals Value Taken Time  BP    Temp    Pulse    Resp    SpO2      Last Pain: There were no vitals filed for this visit.       Complications: No notable events documented.

## 2021-02-09 NOTE — Anesthesia Postprocedure Evaluation (Signed)
Anesthesia Post Note  Patient: Julian Johnson  Procedure(s) Performed: REVISION TRACHEOSTOMY     Patient location during evaluation: SICU Anesthesia Type: General Level of consciousness: sedated Pain management: pain level controlled Vital Signs Assessment: vitals unstable Respiratory status: patient remains intubated per anesthesia plan Cardiovascular status: stable Postop Assessment: no apparent nausea or vomiting Anesthetic complications: no Comments: Patient with visible blood clots in trachea and bilateral mainstem bronchi on evaluation with bronchoscope.  Patient transferred to ICU for further treatment by CCM.   No notable events documented.  Last Vitals: There were no vitals filed for this visit.  Last Pain: There were no vitals filed for this visit.               Cecile Hearing

## 2021-02-09 NOTE — Brief Op Note (Signed)
02/09/2021  2:38 PM  PATIENT:  Julian Johnson  80 y.o. male  PRE-OPERATIVE DIAGNOSIS:  Bleeding around tracheostomy tube  POST-OPERATIVE DIAGNOSIS:  Bleeding around tracheostomy tube  PROCEDURE:  Procedure(s): REVISION TRACHEOSTOMY (N/A) Control of bleeding and changed trach from #6 to # 8  SURGEON:  Surgeon(s) and Role:    Drema Halon, MD - Primary  PHYSICIAN ASSISTANT:   ASSISTANTS: none   ANESTHESIA:   general  EBL:  50 cc   BLOOD ADMINISTERED:none  DRAINS: none   LOCAL MEDICATIONS USED:  NONE  SPECIMEN:  No Specimen  DISPOSITION OF SPECIMEN:  N/A  COUNTS:  YES  TOURNIQUET:  * No tourniquets in log *  DICTATION: .Other Dictation: Dictation Number [id number]  PLAN OF CARE: Discharge to home after PACU  PATIENT DISPOSITION:  PACU - hemodynamically stable.   Delay start of Pharmacological VTE agent (>24hrs) due to surgical blood loss or risk of bleeding: yes

## 2021-02-09 NOTE — Op Note (Deleted)
  The note originally documented on this encounter has been moved the the encounter in which it belongs.  

## 2021-02-09 NOTE — Anesthesia Preprocedure Evaluation (Addendum)
Anesthesia Evaluation   Patient unresponsive    Reviewed: Allergy & Precautions, NPO status , Patient's Chart, lab work & pertinent test results  Airway Mallampati: Intubated  TM Distance: >3 FB Neck ROM: Full    Dental   Pulmonary pneumonia, unresolved,  Chronic respiratory failure POD5 s/p trach now with bleeding  S/p trach    + decreased breath sounds  + intubated    Cardiovascular Exercise Tolerance: Good negative cardio ROS   Rhythm:Regular Rate:Normal     Neuro/Psych CVA negative psych ROS   GI/Hepatic negative GI ROS, Neg liver ROS,   Endo/Other  negative endocrine ROS  Renal/GU negative Renal ROSLab Results      Component                Value               Date                      CREATININE               0.93                02/02/2021                BUN                      83 (H)              02/02/2021                NA                       137                 02/02/2021                K                        4.4                 02/02/2021                CL                       107                 02/02/2021                CO2                      25                  02/02/2021                Musculoskeletal negative musculoskeletal ROS (+)   Abdominal   Peds  Hematology  (+) Blood dyscrasia, anemia ,   Anesthesia Other Findings Day of surgery medications reviewed with the patient.  Reproductive/Obstetrics                            Anesthesia Physical  Anesthesia Plan  ASA: 4 and emergent  Anesthesia Plan: General   Post-op Pain Management:    Induction: Inhalational  PONV Risk Score and Plan: 3 and Treatment may vary due to age or medical condition  Airway  Management Planned: Tracheostomy  Additional Equipment: None  Intra-op Plan:   Post-operative Plan: Post-operative intubation/ventilation  Informed Consent: I have reviewed the patients History and  Physical, chart, labs and discussed the procedure including the risks, benefits and alternatives for the proposed anesthesia with the patient or authorized representative who has indicated his/her understanding and acceptance.     History available from chart only and Only emergency history available  Plan Discussed with: CRNA  Anesthesia Plan Comments:         Anesthesia Quick Evaluation

## 2021-02-09 NOTE — Op Note (Signed)
NAMEABDULWAHAB, DEMELO MEDICAL RECORD NO: [id number] ACCOUNT NO: [id number] DATE OF BIRTH: [birth year] FACILITY: [facility] LOCATION: [facility] PHYSICIAN: Kristine Garbe. Ezzard Standing, MD  Operative Report   DATE OF PROCEDURE: 01/18/2021  PREOPERATIVE DIAGNOSIS:  Bleeding, status post tracheostomy performed 5 days ago.  POSTOPERATIVE DIAGNOSIS:  Bleeding, status post tracheostomy performed 5 days ago.  OPERATION PERFORMED:  Control of bleeding from tracheostomy wound with changing the trach from a #6 Shiley to a #8 Shiley tracheostomy tube.  SURGEON:  Dillard Cannon, MD  ANESTHESIA:  General endotracheal.  ESTIMATED BLOOD LOSS:  Minimal, although the patient had a large amount of bleeding prior to changing the trach.  BRIEF CLINICAL NOTE:  The patient is an 80 year old gentleman who is status post a tracheostomy performed 5 days ago.  He had done well for several days, but was restarted on his blood thinner and started bleeding this morning and bled throughout the day  despite placement of packs around the tracheostomy site.  On exam, at the bedside, the patient had some bright red bleeding around the packing around the tracheostomy site.  It was elected to take the patient to the OR to control the bleeding.  DESCRIPTION OF PROCEDURE:  The patient was brought straight down from [facility] to the OR.  He had a large amount of blood and blood clot around packing around his tracheostomy.  The anterior portion of the C-collar was removed.  The packing  around the tracheostomy was removed.  There was some bright red bleeding oozing around the tracheotomy site.  First, the blood clot was suctioned around the tracheostomy site.  Of note, while the patient was on the floor, he had some bleeding come out of  his mouth with blood clots out of his mouth in addition to around the tracheostomy site.  The #6 tracheostomy tube was removed and a #6 endotracheal tube was placed through the tracheotomy site.  Of  note, on exchanging this, there was a large amount of  blood clot that was partially suctioned out of the trachea.  The bleeding site was identified as a vessel more on the left side up, subcutaneous vessel had been bleeding that was controlled with suction cautery.  There was some difficulty ventilating the  patient initially because of blood clots that had gone down into the trachea and into carina and bronchi.  Portion of these blood clots were suctioned out, although a few blood clots went down into the bronchi.  The patient was changed to a #8  tracheostomy tube without difficulty.  On endoscopy through the tracheostomy, this was in good position with the carina easily visible and some blood clots within the bronchi on either side.  Attempts at suctioning and removing these blood clots was a  little bit difficult because of the small suction being used.  The patient was ventilating adequately, although his sats were in the 80s.  The #8 Shiley tracheostomy tube had some SNoW, Thrombostat packing placed around the tube subcutaneously.  There  was no further bleeding noted and tracheostomy tube was secured with 2-0 silk sutures x4 and a Velcro trach collar around the neck.  The patient was subsequently transferred back to [facility].   [facility] D: 02/09/2021 2:46:55 pm T: 02/09/2021 9:01:00 pm  JOB: [id number]/ [id number]

## 2021-02-10 ENCOUNTER — Inpatient Hospital Stay
Admission: AD | Admit: 2021-02-10 | Discharge: 2021-04-02 | Disposition: E | Payer: No Typology Code available for payment source | Source: Other Acute Inpatient Hospital | Attending: Internal Medicine | Admitting: Internal Medicine

## 2021-02-10 ENCOUNTER — Encounter (HOSPITAL_COMMUNITY): Payer: Self-pay | Admitting: Otolaryngology

## 2021-02-10 ENCOUNTER — Observation Stay (HOSPITAL_COMMUNITY): Payer: No Typology Code available for payment source

## 2021-02-10 DIAGNOSIS — J189 Pneumonia, unspecified organism: Secondary | ICD-10-CM

## 2021-02-10 DIAGNOSIS — I63511 Cerebral infarction due to unspecified occlusion or stenosis of right middle cerebral artery: Secondary | ICD-10-CM | POA: Diagnosis present

## 2021-02-10 DIAGNOSIS — J9501 Hemorrhage from tracheostomy stoma: Secondary | ICD-10-CM

## 2021-02-10 DIAGNOSIS — R0902 Hypoxemia: Secondary | ICD-10-CM

## 2021-02-10 DIAGNOSIS — J90 Pleural effusion, not elsewhere classified: Secondary | ICD-10-CM

## 2021-02-10 DIAGNOSIS — J9601 Acute respiratory failure with hypoxia: Secondary | ICD-10-CM | POA: Diagnosis not present

## 2021-02-10 DIAGNOSIS — N179 Acute kidney failure, unspecified: Secondary | ICD-10-CM

## 2021-02-10 DIAGNOSIS — S12400A Unspecified displaced fracture of fifth cervical vertebra, initial encounter for closed fracture: Secondary | ICD-10-CM | POA: Diagnosis present

## 2021-02-10 DIAGNOSIS — J9690 Respiratory failure, unspecified, unspecified whether with hypoxia or hypercapnia: Secondary | ICD-10-CM

## 2021-02-10 DIAGNOSIS — J9621 Acute and chronic respiratory failure with hypoxia: Secondary | ICD-10-CM | POA: Diagnosis present

## 2021-02-10 DIAGNOSIS — Z9911 Dependence on respirator [ventilator] status: Secondary | ICD-10-CM

## 2021-02-10 LAB — CBC
HCT: 22.4 % — ABNORMAL LOW (ref 39.0–52.0)
Hemoglobin: 7.1 g/dL — ABNORMAL LOW (ref 13.0–17.0)
MCH: 30 pg (ref 26.0–34.0)
MCHC: 31.7 g/dL (ref 30.0–36.0)
MCV: 94.5 fL (ref 80.0–100.0)
Platelets: 250 10*3/uL (ref 150–400)
RBC: 2.37 MIL/uL — ABNORMAL LOW (ref 4.22–5.81)
RDW: 15.3 % (ref 11.5–15.5)
WBC: 9.5 10*3/uL (ref 4.0–10.5)
nRBC: 0 % (ref 0.0–0.2)

## 2021-02-10 LAB — BASIC METABOLIC PANEL
Anion gap: 9 (ref 5–15)
BUN: 56 mg/dL — ABNORMAL HIGH (ref 8–23)
CO2: 30 mmol/L (ref 22–32)
Calcium: 8.5 mg/dL — ABNORMAL LOW (ref 8.9–10.3)
Chloride: 104 mmol/L (ref 98–111)
Creatinine, Ser: 0.67 mg/dL (ref 0.61–1.24)
GFR, Estimated: >60 mL/min (ref >60)
Glucose, Bld: 121 mg/dL — ABNORMAL HIGH (ref 70–99)
Potassium: 3.4 mmol/L — ABNORMAL LOW (ref 3.5–5.1)
Sodium: 143 mmol/L (ref 135–145)

## 2021-02-10 LAB — MAGNESIUM: Magnesium: 1.9 mg/dL (ref 1.7–2.4)

## 2021-02-10 LAB — GLUCOSE, CAPILLARY
Glucose-Capillary: 80 mg/dL (ref 70–99)
Glucose-Capillary: 105 mg/dL — ABNORMAL HIGH (ref 70–99)

## 2021-02-10 LAB — PHOSPHORUS: Phosphorus: 2.4 mg/dL — ABNORMAL LOW (ref 2.5–4.6)

## 2021-02-10 MED ORDER — MIDODRINE HCL 5 MG PO TABS
2.5000 mg | ORAL_TABLET | Freq: Four times a day (QID) | ORAL | Status: DC
  Administered 2021-02-10: 2.5 mg
  Filled 2021-02-10: qty 1

## 2021-02-10 MED ORDER — POTASSIUM PHOSPHATES 15 MMOLE/5ML IV SOLN
30.0000 mmol | Freq: Once | INTRAVENOUS | Status: DC
  Administered 2021-02-10: 30 mmol via INTRAVENOUS
  Filled 2021-02-10: qty 10

## 2021-02-10 MED ORDER — MAGNESIUM SULFATE 2 GM/50ML IV SOLN
2.0000 g | Freq: Once | INTRAVENOUS | Status: AC
  Administered 2021-02-10: 2 g via INTRAVENOUS

## 2021-02-10 MED ORDER — COLLAGENASE 250 UNIT/GM EX OINT
TOPICAL_OINTMENT | Freq: Every day | CUTANEOUS | Status: DC
  Filled 2021-02-10: qty 30

## 2021-02-10 NOTE — Discharge Summary (Addendum)
02/01/2021 Admission diagnosis: respiratory failure related to tracheal occlusion from blood clot Discharge diagnosis: same Attending: Myrla Halsted MD   Chronic issues: Acute on chronic hypoxemic respiratory failure R carotid stent in place on brilinta/eliquis on admit HTN DM2 PAD Pulmonary fibrosis Recurrent aspiration events post R MCA infarct now on vent wean  Hospital Course Patient admitted from [facility] in extremis due to bleeding from tracheostomy tube causing inspissated clot to occlude airway.  Using cryoprobe and copious irrigation this was removed on day of admission which quickly improved oxygenation.   He was monitored overnight and is stable to return to [facility] on 02/09/2021.  His H/H is down a bit, I would probably hold brillinta for today.  Would rechallenge if H/H and trach bleeding stable tomorrow.  Discharge Exam Blood pressure (!) 126/58, pulse 78, temperature 98.8 F (37.1 C), temperature source Oral, resp. rate (!) 29, weight 76.6 kg, SpO2 100 %.  No distress Tracking with eyes Lungs with rhonci worse on L, triggers vent Muscle wasting L hemiparesis, R with some movement to command, very weak Tracks with eyes, moves mouth  K/Mg low, repleting Hgb a bit down  Discharge Meds Allergies as of 02/16/2021       Reactions   Chlorhexidine         Medication List     STOP taking these medications    ticagrelor 90 MG Tabs tablet Commonly known as: BRILINTA       TAKE these medications    acetaminophen 325 MG tablet Commonly known as: TYLENOL Take 650 mg by mouth every 6 (six) hours as needed for moderate pain.   amantadine 50 MG/5ML solution Commonly known as: SYMMETREL Place 100 mg into feeding tube daily.   atorvastatin 10 MG tablet Commonly known as: LIPITOR Place 20 mg into feeding tube daily.   budesonide 0.5 MG/2ML nebulizer solution Commonly known as: PULMICORT Take 0.5 mg by nebulization 2 (two) times daily.   dextrose 2.5 % and  0.45 % NaCl 2.5-0.45 % Inject 1,000 mLs into the vein daily as needed (fluid).   DEXTROSE 50%/ELECTROLYTES IV Inject 50 mLs into the vein daily as needed (fluid).   Fish Oil 1000 MG Caps Place 1,000 mg into feeding tube daily at 6 (six) AM. 1000 mg liquid   furosemide 20 MG tablet Commonly known as: LASIX Place 20 mg into feeding tube daily.   GlucaGen HypoKit 1 MG Solr injection Generic drug: glucagon Inject 1 mg into the muscle once as needed for low blood sugar.   insulin glargine 100 UNIT/ML injection Commonly known as: LANTUS Inject 20 Units into the skin daily.   insulin lispro 100 UNIT/ML injection Commonly known as: HUMALOG Inject 0-12 Units into the skin See admin instructions. Sliding scale : 70-150 = 0 units 151-200 = 2 units 201-250 = 4 units 251 - 300 = 6 units 301- 350 = 8 units 351- 400 = 10 units Over 400 = 12 units and call physician   ipratropium-albuterol 0.5-2.5 (3) MG/3ML Soln Commonly known as: DUONEB Take 3 mLs by nebulization every 6 (six) hours as needed (wheezing).   magnesium oxide 400 MG tablet Commonly known as: MAG-OX Place 400 mg into feeding tube every 6 (six) hours as needed (magnesium deficiency).   Magnesium Sulfate in D5W 1-5 GM/50ML-% Soln Inject 1 Dose into the vein daily as needed (fluid).   melatonin 3 MG Tabs tablet Place 6 mg into feeding tube at bedtime.   metoprolol tartrate 25 MG tablet Commonly known  as: LOPRESSOR Place 12.5 mg into feeding tube 2 (two) times daily.   morphine 2 MG/ML injection Inject 1 mg into the vein every 6 (six) hours as needed (pain).   nicotine 14 mg/24hr patch Commonly known as: NICODERM CQ - dosed in mg/24 hours Place 14 mg onto the skin daily.   nicotine 7 mg/24hr patch Commonly known as: NICODERM CQ - dosed in mg/24 hr Place 7 mg onto the skin daily.   nutrition supplement (JUVEN) Pack Place 1 packet into feeding tube daily.   ondansetron 4 MG/5ML solution Commonly known as:  ZOFRAN Place 4 mg into feeding tube every 6 (six) hours as needed for nausea or vomiting.   ondansetron 4 MG tablet Commonly known as: ZOFRAN Place 4 mg into feeding tube every 6 (six) hours as needed for nausea or vomiting.   oxycodone 5 MG capsule Commonly known as: OXY-IR Take 5 mg by mouth every 6 (six) hours as needed for pain.   pantoprazole 40 MG injection Commonly known as: PROTONIX Inject 40 mg into the vein 2 (two) times daily.   polyethylene glycol 17 g packet Commonly known as: MIRALAX / GLYCOLAX Take 17 g by mouth daily.   Potassium Acetate-NaCl 10 MEQ/100ML Soln Inject 10 mEq into the vein daily as needed (potassium deficiency).   Potassium Bicarb-Citric Acid 20 MEQ Tbef Place 40 mEq into feeding tube every 4 (four) hours as needed (potassium deficiency).   potassium chloride 20 MEQ packet Commonly known as: KLOR-CON Place 40 mEq into feeding tube every 4 (four) hours as needed (potassium deficiency). crystals   senna-docusate 8.6-50 MG tablet Commonly known as: Senokot-S Place 2 tablets into feeding tube at bedtime.   sertraline 50 MG tablet Commonly known as: ZOLOFT Take 50 mg by mouth daily.   sodium polystyrene 15 GM/60ML suspension Commonly known as: KAYEXALATE Place 15-30 g into feeding tube daily as needed (high potassium level).   Tab-A-Vite/Beta Carotene Tabs Place 1 tablet into feeding tube daily.   tamsulosin 0.4 MG Caps capsule Commonly known as: FLOMAX Take 0.4 mg by mouth daily. Per tube   thiamine 100 MG tablet Place 100 mg into feeding tube daily.       36 minutes spent arranging hospital discharge  Myrla Halsted MD [place] [facility] Prefer epic messenger for cross cover needs If after hours, please call E-link

## 2021-02-10 NOTE — Consult Note (Signed)
WOC Nurse Consult Note: Patient receiving care in [facility] 2M05 This patient is on a ventilator support and has a c-collar on. According to Dr. Levon Hedger, patient is stable to return to [facility]. Reason for Consult: Multiple wounds Wound type: Left heel measures 2.2 cm x 1 cm black escar Right heel measures 3.8 cm x 3 cm unstageable Coccyx wound measures 4 cm x 5 cm unstageable 95% yellow with surrounding pink border and periwound maceration. Right posterior upper thigh measures 2.8 cm x 2.8 cm and appears to be a ruptured bulla 100% pink Pressure Injury POA: Yes Dressing procedure/placement/frequency: Paint the bilateral heels with Betadine and allow to air dry. Replace heel foam dressings and place both feet in Prevalon Heel Lift boots. Clean the sacral area with no rinse cleanser, pat dry and apply a nickel thick layer of santyl over the coccyx wound, cover with moistened saline gauze, dry gauze and sacral foam dressing. Change daily. Clean the right posterior upper thigh wound with soap and water, rinse and pat dry. Continue foam dressing to this area. Change every 3 days. Lift foam every shift and assess the area, document findings.   Pressure Injury Prevention Bundle Support surfaces (air mattress) chair cushion [place] # [phone]) Heel offloading boots [place] # [phone]) Turning and Positioning  Measures to reduce shear (draw sheet, knees up) Skin protection Products (Foam dressing) Moisture management products (Critic-Aid Barrier Cream (Purple top) Nutrition Management Protection for Medical Devices Routine Skin Assessment   Monitor the wound area(s) for worsening of condition such as: Signs/symptoms of infection, increase in size, development of or worsening of odor, development of pain, or increased pain at the affected locations.   Notify the medical team if any of these develop.  Thank you for the consult. WOC nurse will not follow at this time.   Please re-consult the WOC team if  needed.  Renaldo Reel Katrinka Blazing, MSN, RN, CMSRN, Angus Seller, [facility] Wound Treatment Associate Pager [phone]

## 2021-02-10 NOTE — TOC Transition Note (Addendum)
Transition of Care [facility]) - CM/SW Discharge Note   Patient Details  Name: Antar Milks MRN: [id number] Date of Birth: 09-13-1940  Transition of Care [facility]) CM/SW Contact:  Epifanio Lesches, RN Phone Number: 02/26/2021, 9:42 AM   Clinical Narrative:    [facility] will DC to: [facility] Anticipated DC date: 02/07/2021 Family notified: yes, wife Tobi Bastos) Transport by: nurse   Per MD patient ready for DC today. RN, patient, patient's wife, and [facility]  aware of DC. D/C summary in Place. Nurse to call and give report, [phone]. Pt will return to Rm# [id number]. Receiving MD, Dr.Hijaci. Kavish Lafitte (Spouse)      [phone]      RNCM will sign off for now as intervention is no longer needed. Please consult [place] again if new needs arise.    Final next level of care: Long Term Acute Care (LTAC) Barriers to Discharge: No Barriers Identified   Patient Goals and CMS Choice        Discharge Placement                       Discharge Plan and Services                                     Social Determinants of Health (SDOH) Interventions     Readmission Risk Interventions No flowsheet data found.

## 2021-02-10 NOTE — Progress Notes (Signed)
Stable to return to [facility]. Called [facility] charge to inform, they will reach out to [facility]M once everything is arranged. Awaiting TOC for whatever approvals are needed. DC summary and DC med rec pended.  Myrla Halsted MD PCCM

## 2021-02-11 DIAGNOSIS — I63511 Cerebral infarction due to unspecified occlusion or stenosis of right middle cerebral artery: Secondary | ICD-10-CM | POA: Diagnosis not present

## 2021-02-11 DIAGNOSIS — J9621 Acute and chronic respiratory failure with hypoxia: Secondary | ICD-10-CM | POA: Diagnosis not present

## 2021-02-11 DIAGNOSIS — S12401S Unspecified nondisplaced fracture of fifth cervical vertebra, sequela: Secondary | ICD-10-CM | POA: Diagnosis not present

## 2021-02-11 DIAGNOSIS — J189 Pneumonia, unspecified organism: Secondary | ICD-10-CM | POA: Diagnosis not present

## 2021-02-11 DIAGNOSIS — J9501 Hemorrhage from tracheostomy stoma: Secondary | ICD-10-CM

## 2021-02-11 LAB — CBC
HCT: 24.1 % — ABNORMAL LOW (ref 39.0–52.0)
Hemoglobin: 7.6 g/dL — ABNORMAL LOW (ref 13.0–17.0)
MCH: 29.9 pg (ref 26.0–34.0)
MCHC: 31.5 g/dL (ref 30.0–36.0)
MCV: 94.9 fL (ref 80.0–100.0)
Platelets: 264 10*3/uL (ref 150–400)
RBC: 2.54 MIL/uL — ABNORMAL LOW (ref 4.22–5.81)
RDW: 15.4 % (ref 11.5–15.5)
WBC: 11.1 10*3/uL — ABNORMAL HIGH (ref 4.0–10.5)
nRBC: 0 % (ref 0.0–0.2)

## 2021-02-11 LAB — POTASSIUM: Potassium: 4.3 mmol/L (ref 3.5–5.1)

## 2021-02-11 NOTE — Progress Notes (Signed)
Pulmonary Critical Care Medicine [facility] [place]   PULMONARY CRITICAL CARE SERVICE  PROGRESS NOTE     [name]  [id number]:[id number]  DOB: 12-31-1940   DOA: 02-12-2021  Referring Physician: Carron Curie, MD  HPI: Julian Johnson is a 80 y.o. male being followed for ventilator/airway/oxygen weaning Acute on Chronic Respiratory Failure.  Patient comfortable right now without distress at this time no fevers are noted  Medications: Reviewed on Rounds  Physical Exam:  Vitals: Temperature was 98.7 pulse 73 respiratory rate is 30 blood pressure was 143/78 saturations 96%  Ventilator Settings on pressure assist control FiO2 45% PEEP 7 tidal volume 530  General: Comfortable at this time Neck: supple Cardiovascular: no malignant arrhythmias Respiratory: Scattered rhonchi expansion is equal at this time Skin: no rash seen on limited exam Musculoskeletal: No gross abnormality Psychiatric:unable to assess Neurologic:no involuntary movements         Lab Data:   Basic Metabolic Panel: Recent Labs  Lab 02/05/21 0610 02/07/21 0420 02/09/21 1735 02/09/21 1907 02/12/21 0532 02/11/21 0358  NA 139 140 146* 143 143  --   K 4.7 4.3 3.7 4.0 3.4* 4.3  CL 107 105  --  106 104  --   CO2 25 27  --  29 30  --   GLUCOSE 190* 189*  --  94 121*  --   BUN 66* 53*  --  54* 56*  --   CREATININE 0.79 0.69  --  0.60* 0.67  --   CALCIUM 8.4* 8.4*  --  8.3* 8.5*  --   MG 2.0 2.0  --  1.8 1.9  --   PHOS 3.3 2.9  --  3.1 2.4*  --     ABG: Recent Labs  Lab 02/09/21 1735  PHART 7.487*  PCO2ART 46.2  PO2ART 332*  HCO3 35.0*  O2SAT 100.0    Liver Function Tests: Recent Labs  Lab 02/09/21 1907  ALBUMIN 1.4*   No results for input(s): LIPASE, AMYLASE in the last 168 hours. No results for input(s): AMMONIA in the last 168 hours.  CBC: Recent Labs  Lab 02/09/21 1126 02/09/21 1710 02/09/21 1735 02/09/21 1907 2021/02/12 0532 02/11/21 0358  WBC 9.5 11.4*  --  13.8* 9.5 11.1*   HGB 9.3* 9.1* 7.5* 8.2* 7.1* 7.6*  HCT 31.1* 29.4* 22.0* 27.1* 22.4* 24.1*  MCV 98.1 96.7  --  97.5 94.5 94.9  PLT 290 297  --  271 250 264    Cardiac Enzymes: No results for input(s): CKTOTAL, CKMB, CKMBINDEX, TROPONINI in the last 168 hours.  BNP (last 3 results) No results for input(s): BNP in the last 8760 hours.  ProBNP (last 3 results) No results for input(s): PROBNP in the last 8760 hours.  Radiological Exams: Portable Chest xray  Result Date: 2021-02-12 CLINICAL DATA:  Respiratory failure EXAM: PORTABLE CHEST 1 VIEW COMPARISON:  02/09/2021 FINDINGS: Tracheostomy remains in good position. Left lower lobe consolidation and small left effusion unchanged. COPD. Right lung remains clear. Calcified granuloma right upper lobe. IMPRESSION: Left lower lobe consolidation and small left effusion unchanged. Electronically Signed   By: Marlan Palau M.D.   On: 02/12/21 08:18   DG Chest Port 1 View  Result Date: 02/09/2021 CLINICAL DATA:  Hypoxia EXAM: PORTABLE CHEST 1 VIEW COMPARISON:  02/08/2021 FINDINGS: Tracheostomy remains in place, unchanged. Small left pleural effusion. Left lower lobe airspace disease similar to prior study. Heart is normal size. No confluent opacity on the right. Calcified granuloma in the right mid lung.  IMPRESSION: Continued left lower lobe consolidation with small left effusion, unchanged. Electronically Signed   By: Charlett Nose M.D.   On: 02/09/2021 18:00    Assessment/Plan Active Problems:   Acute on chronic respiratory failure with hypoxia (HCC)   Acute ischemic right MCA stroke (HCC)   C5 cervical fracture (HCC)   Healthcare-associated pneumonia   Tracheostomy hemorrhage (HCC)   Acute on chronic respiratory failure hypoxia plan is going to be to continue with the ventilator and full support.  Continue secretion management pulmonary toilet. Acute stroke no change continue to follow along C5 fracture stable at this time remains in the neck  collar Healthcare associated pneumonia has been treated with antibiotics supportive care follow-up x-rays the most recent chest x-ray show some consolidation on the left side with an effusion Tracheal hemorrhage with tracheostomy site patient is doing better no side of active bleeding right now   I have personally seen and evaluated the patient, evaluated laboratory and imaging results, formulated the assessment and plan and placed orders. The Patient requires high complexity decision making with multiple systems involvement.  Rounds were done with the Respiratory Therapy Director and Staff therapists and discussed with nursing staff also.  Yevonne Pax, MD [facility] Pulmonary Critical Care Medicine Sleep Medicine

## 2021-02-13 DIAGNOSIS — J9621 Acute and chronic respiratory failure with hypoxia: Secondary | ICD-10-CM | POA: Diagnosis not present

## 2021-02-13 DIAGNOSIS — I63511 Cerebral infarction due to unspecified occlusion or stenosis of right middle cerebral artery: Secondary | ICD-10-CM | POA: Diagnosis not present

## 2021-02-13 DIAGNOSIS — J189 Pneumonia, unspecified organism: Secondary | ICD-10-CM | POA: Diagnosis not present

## 2021-02-13 DIAGNOSIS — J9501 Hemorrhage from tracheostomy stoma: Secondary | ICD-10-CM | POA: Diagnosis not present

## 2021-02-13 NOTE — Progress Notes (Signed)
Pulmonary Critical Care Medicine [facility] [place]   PULMONARY CRITICAL CARE SERVICE  PROGRESS NOTE     Brannan Cassedy  [id number]  DOB: [birth year]   DOA: 02/02/2021  Referring Physician: Carron Curie, MD  HPI: Kemon Devincenzi is a 80 y.o. male being followed for ventilator/airway/oxygen weaning Acute on Chronic Respiratory Failure.  Patient is comfortable right now without distress remains on the ventilator assist-control mode no further bleeding is noted from the tracheostomy  Medications: Reviewed on Rounds  Physical Exam:  Vitals: Temperature is 99.5 pulse 77 respiratory rate is 24 blood pressure is 134/58 saturations 94%  Ventilator Settings on the ventilator right now on pressure control mode has been on 45% FiO2 PEEP of 7 tidal volume 530  General: Comfortable at this time Neck: supple Cardiovascular: no malignant arrhythmias Respiratory: Scattered rhonchi expansion is equal Skin: no rash seen on limited exam Musculoskeletal: No gross abnormality Psychiatric:unable to assess Neurologic:no involuntary movements         Lab Data:   Basic Metabolic Panel: Recent Labs  Lab 02/07/21 0420 02/09/21 1735 02/09/21 1907 02/11/2021 0532 02/11/21 0358  NA 140 146* 143 143  --   K 4.3 3.7 4.0 3.4* 4.3  CL 105  --  106 104  --   CO2 27  --  29 30  --   GLUCOSE 189*  --  94 121*  --   BUN 53*  --  54* 56*  --   CREATININE 0.69  --  0.60* 0.67  --   CALCIUM 8.4*  --  8.3* 8.5*  --   MG 2.0  --  1.8 1.9  --   PHOS 2.9  --  3.1 2.4*  --     ABG: Recent Labs  Lab 02/09/21 1735  PHART 7.487*  PCO2ART 46.2  PO2ART 332*  HCO3 35.0*  O2SAT 100.0    Liver Function Tests: Recent Labs  Lab 02/09/21 1907  ALBUMIN 1.4*   No results for input(s): LIPASE, AMYLASE in the last 168 hours. No results for input(s): AMMONIA in the last 168 hours.  CBC: Recent Labs  Lab 02/09/21 1126 02/09/21 1710 02/09/21 1735 02/09/21 1907 02/03/2021 0532 02/11/21 0358   WBC 9.5 11.4*  --  13.8* 9.5 11.1*  HGB 9.3* 9.1* 7.5* 8.2* 7.1* 7.6*  HCT 31.1* 29.4* 22.0* 27.1* 22.4* 24.1*  MCV 98.1 96.7  --  97.5 94.5 94.9  PLT 290 297  --  271 250 264    Cardiac Enzymes: No results for input(s): CKTOTAL, CKMB, CKMBINDEX, TROPONINI in the last 168 hours.  BNP (last 3 results) No results for input(s): BNP in the last 8760 hours.  ProBNP (last 3 results) No results for input(s): PROBNP in the last 8760 hours.  Radiological Exams: No results found.  Assessment/Plan Active Problems:   Acute on chronic respiratory failure with hypoxia (HCC)   Acute ischemic right MCA stroke (HCC)   C5 cervical fracture (HCC)   Healthcare-associated pneumonia   Tracheostomy hemorrhage (HCC)   Acute on chronic respiratory failure hypoxia patient is resting comfortably right now without distress at this time and the coming week and will try to start weaning Acute stroke no change we will continue to follow along closely. C5 fracture overall no change Healthcare associated pneumonia treated Tracheal hemorrhage no change we will continue to follow along closely.   I have personally seen and evaluated the patient, evaluated laboratory and imaging results, formulated the assessment and plan and placed orders. The Patient requires  high complexity decision making with multiple systems involvement.  Rounds were done with the Respiratory Therapy Director and Staff therapists and discussed with nursing staff also.  Allyne Gee, MD [facility] Pulmonary Critical Care Medicine Sleep Medicine

## 2021-02-14 ENCOUNTER — Other Ambulatory Visit (HOSPITAL_COMMUNITY): Payer: No Typology Code available for payment source

## 2021-02-14 DIAGNOSIS — J189 Pneumonia, unspecified organism: Secondary | ICD-10-CM | POA: Diagnosis not present

## 2021-02-14 DIAGNOSIS — I63511 Cerebral infarction due to unspecified occlusion or stenosis of right middle cerebral artery: Secondary | ICD-10-CM | POA: Diagnosis not present

## 2021-02-14 DIAGNOSIS — S12401S Unspecified nondisplaced fracture of fifth cervical vertebra, sequela: Secondary | ICD-10-CM | POA: Diagnosis not present

## 2021-02-14 DIAGNOSIS — J9621 Acute and chronic respiratory failure with hypoxia: Secondary | ICD-10-CM | POA: Diagnosis not present

## 2021-02-14 LAB — URINALYSIS, ROUTINE W REFLEX MICROSCOPIC
Bilirubin Urine: NEGATIVE
Glucose, UA: NEGATIVE mg/dL
Hgb urine dipstick: NEGATIVE
Ketones, ur: NEGATIVE mg/dL
Nitrite: NEGATIVE
Protein, ur: 30 mg/dL — AB
Specific Gravity, Urine: 1.016 (ref 1.005–1.030)
pH: 5 (ref 5.0–8.0)

## 2021-02-14 LAB — CBC
HCT: 22.6 % — ABNORMAL LOW (ref 39.0–52.0)
Hemoglobin: 7.1 g/dL — ABNORMAL LOW (ref 13.0–17.0)
MCH: 29.5 pg (ref 26.0–34.0)
MCHC: 31.4 g/dL (ref 30.0–36.0)
MCV: 93.8 fL (ref 80.0–100.0)
Platelets: 248 10*3/uL (ref 150–400)
RBC: 2.41 MIL/uL — ABNORMAL LOW (ref 4.22–5.81)
RDW: 15.7 % — ABNORMAL HIGH (ref 11.5–15.5)
WBC: 10.1 10*3/uL (ref 4.0–10.5)
nRBC: 0 % (ref 0.0–0.2)

## 2021-02-14 LAB — BASIC METABOLIC PANEL
Anion gap: 8 (ref 5–15)
BUN: 73 mg/dL — ABNORMAL HIGH (ref 8–23)
CO2: 31 mmol/L (ref 22–32)
Calcium: 8.2 mg/dL — ABNORMAL LOW (ref 8.9–10.3)
Chloride: 104 mmol/L (ref 98–111)
Creatinine, Ser: 0.88 mg/dL (ref 0.61–1.24)
GFR, Estimated: >60 mL/min (ref >60)
Glucose, Bld: 212 mg/dL — ABNORMAL HIGH (ref 70–99)
Potassium: 3.8 mmol/L (ref 3.5–5.1)
Sodium: 143 mmol/L (ref 135–145)

## 2021-02-14 LAB — MAGNESIUM: Magnesium: 2.3 mg/dL (ref 1.7–2.4)

## 2021-02-14 NOTE — Progress Notes (Signed)
Pulmonary Critical Care Medicine [facility] [place]   PULMONARY CRITICAL CARE SERVICE  PROGRESS NOTE     Julian Johnson  [id number]:[id number]  DOB: April 18, 1941   DOA: 03/09/21  Referring Physician: Carron Curie, MD  HPI: Julian Johnson is a 80 y.o. male being followed for ventilator/airway/oxygen weaning Acute on Chronic Respiratory Failure.  Patient is resting comfortably right now without distress has low-grade fever noted  Medications: Reviewed on Rounds  Physical Exam:  Vitals: Temperature is 100.3 pulse 74 respiratory 22 blood pressure is 156/61 saturations 97%  Ventilator Settings on assist control FiO2 is 40% tidal volume 530 PEEP 7  General: Comfortable at this time Neck: supple Cardiovascular: no malignant arrhythmias Respiratory: No rhonchi very coarse breath sounds Skin: no rash seen on limited exam Musculoskeletal: No gross abnormality Psychiatric:unable to assess Neurologic:no involuntary movements         Lab Data:   Basic Metabolic Panel: Recent Labs  Lab 02/09/21 1735 02/09/21 1907 2021-03-09 0532 02/11/21 0358  NA 146* 143 143  --   K 3.7 4.0 3.4* 4.3  CL  --  106 104  --   CO2  --  29 30  --   GLUCOSE  --  94 121*  --   BUN  --  54* 56*  --   CREATININE  --  0.60* 0.67  --   CALCIUM  --  8.3* 8.5*  --   MG  --  1.8 1.9  --   PHOS  --  3.1 2.4*  --     ABG: Recent Labs  Lab 02/09/21 1735  PHART 7.487*  PCO2ART 46.2  PO2ART 332*  HCO3 35.0*  O2SAT 100.0    Liver Function Tests: Recent Labs  Lab 02/09/21 1907  ALBUMIN 1.4*   No results for input(s): LIPASE, AMYLASE in the last 168 hours. No results for input(s): AMMONIA in the last 168 hours.  CBC: Recent Labs  Lab 02/09/21 1126 02/09/21 1710 02/09/21 1735 02/09/21 1907 09-Mar-2021 0532 02/11/21 0358  WBC 9.5 11.4*  --  13.8* 9.5 11.1*  HGB 9.3* 9.1* 7.5* 8.2* 7.1* 7.6*  HCT 31.1* 29.4* 22.0* 27.1* 22.4* 24.1*  MCV 98.1 96.7  --  97.5 94.5 94.9  PLT 290 297  --  271  250 264    Cardiac Enzymes: No results for input(s): CKTOTAL, CKMB, CKMBINDEX, TROPONINI in the last 168 hours.  BNP (last 3 results) No results for input(s): BNP in the last 8760 hours.  ProBNP (last 3 results) No results for input(s): PROBNP in the last 8760 hours.  Radiological Exams: DG Chest Port 1 View  Result Date: 02/14/2021 CLINICAL DATA:  Pneumonia. EXAM: PORTABLE CHEST 1 VIEW COMPARISON:  Chest x-ray dated 03-09-21. FINDINGS: Unchanged tracheostomy tube. Stable cardiomediastinal silhouette. Similar to slightly worsening left lower lobe consolidation with unchanged small left pleural effusion. Increased patchy opacities at the right lung base. No pneumothorax. No acute osseous abnormality. IMPRESSION: 1. Similar to slightly worsening left lower lobe pneumonia with unchanged small left pleural effusion. 2. New right basilar pneumonia. Electronically Signed   By: Obie Dredge M.D.   On: 02/14/2021 06:41    Assessment/Plan Active Problems:   Acute on chronic respiratory failure with hypoxia (HCC)   Acute ischemic right MCA stroke (HCC)   C5 cervical fracture (HCC)   Healthcare-associated pneumonia   Tracheostomy hemorrhage (HCC)   Acute on chronic respiratory failure hypoxia I spoken to respiratory therapy during rounds we will try to resume the patient  weaning Acute stroke no change we will continue with supportive care C5 fracture patient is at baseline cervical collar in place Tracheal hemorrhage resolved Healthcare associated pneumonia clinically is improving   I have personally seen and evaluated the patient, evaluated laboratory and imaging results, formulated the assessment and plan and placed orders. The Patient requires high complexity decision making with multiple systems involvement.  Rounds were done with the Respiratory Therapy Director and Staff therapists and discussed with nursing staff also.  Yevonne Pax, MD [facility] Pulmonary Critical Care  Medicine Sleep Medicine

## 2021-02-15 DIAGNOSIS — S12401S Unspecified nondisplaced fracture of fifth cervical vertebra, sequela: Secondary | ICD-10-CM | POA: Diagnosis not present

## 2021-02-15 DIAGNOSIS — J9621 Acute and chronic respiratory failure with hypoxia: Secondary | ICD-10-CM | POA: Diagnosis not present

## 2021-02-15 DIAGNOSIS — J189 Pneumonia, unspecified organism: Secondary | ICD-10-CM | POA: Diagnosis not present

## 2021-02-15 DIAGNOSIS — I63511 Cerebral infarction due to unspecified occlusion or stenosis of right middle cerebral artery: Secondary | ICD-10-CM | POA: Diagnosis not present

## 2021-02-15 NOTE — Progress Notes (Addendum)
Pulmonary Critical Care Medicine [facility] [place]   PULMONARY CRITICAL CARE SERVICE  PROGRESS NOTE     Dax Murguia  GYI:[id number]  DOB: 1940-08-30   DOA: 02/26/2021  Referring Physician: Carron Curie, MD  HPI: Julian Johnson is a 80 y.o. male being followed for ventilator/airway/oxygen weaning Acute on Chronic Respiratory Failure.  Patient is resting comfortably right now without distress was admitted to 2 hours of pressure support yesterday this morning has been already started on pressure support looks good so far  Medications: Reviewed on Rounds  Physical Exam:  Vitals: Temperature is 99.9 pulse 77 respiratory 36 blood pressure is 123/53 saturations 94%  Ventilator Settings currently on pressure support FiO2 45% pressure 12/7  General: Comfortable at this time Neck: supple Cardiovascular: no malignant arrhythmias Respiratory: No rhonchi very coarse breath Skin: no rash seen on limited exam Musculoskeletal: No gross abnormality Psychiatric:unable to assess Neurologic:no involuntary movements         Lab Data:   Basic Metabolic Panel: Recent Labs  Lab 02/09/21 1735 02/09/21 1907 02/25/2021 0532 02/11/21 0358 02/14/21 1122  NA 146* 143 143  --  143  K 3.7 4.0 3.4* 4.3 3.8  CL  --  106 104  --  104  CO2  --  29 30  --  31  GLUCOSE  --  94 121*  --  212*  BUN  --  54* 56*  --  73*  CREATININE  --  0.60* 0.67  --  0.88  CALCIUM  --  8.3* 8.5*  --  8.2*  MG  --  1.8 1.9  --  2.3  PHOS  --  3.1 2.4*  --   --     ABG: Recent Labs  Lab 02/09/21 1735  PHART 7.487*  PCO2ART 46.2  PO2ART 332*  HCO3 35.0*  O2SAT 100.0    Liver Function Tests: Recent Labs  Lab 02/09/21 1907  ALBUMIN 1.4*   No results for input(s): LIPASE, AMYLASE in the last 168 hours. No results for input(s): AMMONIA in the last 168 hours.  CBC: Recent Labs  Lab 02/09/21 1710 02/09/21 1735 02/09/21 1907 02/05/2021 0532 02/11/21 0358 02/14/21 1122  WBC 11.4*  --  13.8*  9.5 11.1* 10.1  HGB 9.1* 7.5* 8.2* 7.1* 7.6* 7.1*  HCT 29.4* 22.0* 27.1* 22.4* 24.1* 22.6*  MCV 96.7  --  97.5 94.5 94.9 93.8  PLT 297  --  271 250 264 248    Cardiac Enzymes: No results for input(s): CKTOTAL, CKMB, CKMBINDEX, TROPONINI in the last 168 hours.  BNP (last 3 results) No results for input(s): BNP in the last 8760 hours.  ProBNP (last 3 results) No results for input(s): PROBNP in the last 8760 hours.  Radiological Exams: DG Chest Port 1 View  Result Date: 02/14/2021 CLINICAL DATA:  Pneumonia. EXAM: PORTABLE CHEST 1 VIEW COMPARISON:  Chest x-ray dated February 10, 2021. FINDINGS: Unchanged tracheostomy tube. Stable cardiomediastinal silhouette. Similar to slightly worsening left lower lobe consolidation with unchanged small left pleural effusion. Increased patchy opacities at the right lung base. No pneumothorax. No acute osseous abnormality. IMPRESSION: 1. Similar to slightly worsening left lower lobe pneumonia with unchanged small left pleural effusion. 2. New right basilar pneumonia. Electronically Signed   By: Obie Dredge M.D.   On: 02/14/2021 06:41    Assessment/Plan Active Problems:   Acute on chronic respiratory failure with hypoxia (HCC)   Acute ischemic right MCA stroke (HCC)   C5 cervical fracture (HCC)   Healthcare-associated  pneumonia   Tracheostomy hemorrhage (HCC)   Acute on chronic respiratory failure hypoxia plan is continuing on the pressure support patient supposed to do 4 hours of weaning today Acute stroke overall no change we will continue to monitor closely. C5 cervical fracture supportive care Healthcare associated pneumonia treated we will continue to monitor Tracheal hemorrhage no sign of active bleeding   I have personally seen and evaluated the patient, evaluated laboratory and imaging results, formulated the assessment and plan and placed orders. The Patient requires high complexity decision making with multiple systems involvement.   Rounds were done with the Respiratory Therapy Director and Staff therapists and discussed with nursing staff also.  Yevonne Pax, MD [facility] Pulmonary Critical Care Medicine Sleep Medicine

## 2021-02-16 DIAGNOSIS — J189 Pneumonia, unspecified organism: Secondary | ICD-10-CM | POA: Diagnosis not present

## 2021-02-16 DIAGNOSIS — S12401S Unspecified nondisplaced fracture of fifth cervical vertebra, sequela: Secondary | ICD-10-CM | POA: Diagnosis not present

## 2021-02-16 DIAGNOSIS — I63511 Cerebral infarction due to unspecified occlusion or stenosis of right middle cerebral artery: Secondary | ICD-10-CM | POA: Diagnosis not present

## 2021-02-16 DIAGNOSIS — J9621 Acute and chronic respiratory failure with hypoxia: Secondary | ICD-10-CM | POA: Diagnosis not present

## 2021-02-16 LAB — BLOOD GAS, ARTERIAL
Acid-Base Excess: 4.1 mmol/L — ABNORMAL HIGH (ref 0.0–2.0)
Bicarbonate: 28.5 mmol/L — ABNORMAL HIGH (ref 20.0–28.0)
FIO2: 35
O2 Saturation: 92 %
Patient temperature: 37
pCO2 arterial: 45.8 mmHg (ref 32.0–48.0)
pH, Arterial: 7.411 (ref 7.350–7.450)
pO2, Arterial: 62.6 mmHg — ABNORMAL LOW (ref 83.0–108.0)

## 2021-02-16 LAB — URINE CULTURE: Culture: >=100000 — AB

## 2021-02-16 LAB — BASIC METABOLIC PANEL
Anion gap: 7 (ref 5–15)
BUN: 88 mg/dL — ABNORMAL HIGH (ref 8–23)
CO2: 30 mmol/L (ref 22–32)
Calcium: 8.1 mg/dL — ABNORMAL LOW (ref 8.9–10.3)
Chloride: 107 mmol/L (ref 98–111)
Creatinine, Ser: 0.89 mg/dL (ref 0.61–1.24)
GFR, Estimated: >60 mL/min (ref >60)
Glucose, Bld: 200 mg/dL — ABNORMAL HIGH (ref 70–99)
Potassium: 4.2 mmol/L (ref 3.5–5.1)
Sodium: 144 mmol/L (ref 135–145)

## 2021-02-16 LAB — CBC
HCT: 20.2 % — ABNORMAL LOW (ref 39.0–52.0)
Hemoglobin: 6.3 g/dL — CL (ref 13.0–17.0)
MCH: 29.4 pg (ref 26.0–34.0)
MCHC: 31.2 g/dL (ref 30.0–36.0)
MCV: 94.4 fL (ref 80.0–100.0)
Platelets: 273 10*3/uL (ref 150–400)
RBC: 2.14 MIL/uL — ABNORMAL LOW (ref 4.22–5.81)
RDW: 15.5 % (ref 11.5–15.5)
WBC: 9.1 10*3/uL (ref 4.0–10.5)
nRBC: 0.6 % — ABNORMAL HIGH (ref 0.0–0.2)

## 2021-02-16 LAB — MAGNESIUM: Magnesium: 2.2 mg/dL (ref 1.7–2.4)

## 2021-02-16 LAB — PREPARE RBC (CROSSMATCH)

## 2021-02-16 NOTE — Progress Notes (Signed)
Pulmonary Critical Care Medicine [facility] [place]   PULMONARY CRITICAL CARE SERVICE  PROGRESS NOTE     Julian Johnson  [id number]  DOB: 03-06-1941   DOA: 03/12/21  Referring Physician: Carron Curie, MD  HPI: Julian Johnson is a 80 y.o. male being followed for ventilator/airway/oxygen weaning Acute on Chronic Respiratory Failure.  Patient is comfortable right now has been on a pressure support 12/71.  Findings are noted  Medications: Reviewed on Rounds  Physical Exam:  Vitals: Temperature is 96.9 pulse 71 respiratory is 30 blood pressure is 123/62 saturations 97%  Ventilator Settings on pressure support FiO2 40% pressure 12/7 tidal volume 442  General: Comfortable at this time Neck: supple Cardiovascular: no malignant arrhythmias Respiratory: No rhonchi very coarse breath sounds Skin: no rash seen on limited exam Musculoskeletal: No gross abnormality Psychiatric:unable to assess Neurologic:no involuntary movements         Lab Data:   Basic Metabolic Panel: Recent Labs  Lab 02/09/21 1735 02/09/21 1907 12-Mar-2021 0532 02/11/21 0358 02/14/21 1122 02/16/21 0322  NA 146* 143 143  --  143 144  K 3.7 4.0 3.4* 4.3 3.8 4.2  CL  --  106 104  --  104 107  CO2  --  29 30  --  31 30  GLUCOSE  --  94 121*  --  212* 200*  BUN  --  54* 56*  --  73* 88*  CREATININE  --  0.60* 0.67  --  0.88 0.89  CALCIUM  --  8.3* 8.5*  --  8.2* 8.1*  MG  --  1.8 1.9  --  2.3 2.2  PHOS  --  3.1 2.4*  --   --   --     ABG: Recent Labs  Lab 02/09/21 1735  PHART 7.487*  PCO2ART 46.2  PO2ART 332*  HCO3 35.0*  O2SAT 100.0    Liver Function Tests: Recent Labs  Lab 02/09/21 1907  ALBUMIN 1.4*   No results for input(s): LIPASE, AMYLASE in the last 168 hours. No results for input(s): AMMONIA in the last 168 hours.  CBC: Recent Labs  Lab 02/09/21 1907 03-12-2021 0532 02/11/21 0358 02/14/21 1122 02/16/21 0322  WBC 13.8* 9.5 11.1* 10.1 9.1  HGB 8.2* 7.1* 7.6* 7.1*  6.3*  HCT 27.1* 22.4* 24.1* 22.6* 20.2*  MCV 97.5 94.5 94.9 93.8 94.4  PLT 271 250 264 248 273    Cardiac Enzymes: No results for input(s): CKTOTAL, CKMB, CKMBINDEX, TROPONINI in the last 168 hours.  BNP (last 3 results) No results for input(s): BNP in the last 8760 hours.  ProBNP (last 3 results) No results for input(s): PROBNP in the last 8760 hours.  Radiological Exams: No results found.  Assessment/Plan Active Problems:   Acute on chronic respiratory failure with hypoxia (HCC)   Acute ischemic right MCA stroke (HCC)   C5 cervical fracture (HCC)   Healthcare-associated pneumonia   Tracheostomy hemorrhage (HCC)   Acute on chronic respiratory failure hypoxia plan is to continue with the weaning on pressure support as tolerated.  Of note patient's hemoglobin was 6.3 will likely require blood defer to primary care team Acute stroke no change we will continue to follow along closely. C5 fractures no change continue present management Healthcare associated pneumonia treated slow improvement Tracheostomy understood no sign of active bleeding from the airway   I have personally seen and evaluated the patient, evaluated laboratory and imaging results, formulated the assessment and plan and placed orders. The Patient requires high complexity  decision making with multiple systems involvement.  Rounds were done with the Respiratory Therapy Director and Staff therapists and discussed with nursing staff also.  Allyne Gee, MD [facility] Pulmonary Critical Care Medicine Sleep Medicine

## 2021-02-17 DIAGNOSIS — S12401S Unspecified nondisplaced fracture of fifth cervical vertebra, sequela: Secondary | ICD-10-CM | POA: Diagnosis not present

## 2021-02-17 DIAGNOSIS — I63511 Cerebral infarction due to unspecified occlusion or stenosis of right middle cerebral artery: Secondary | ICD-10-CM | POA: Diagnosis not present

## 2021-02-17 DIAGNOSIS — J9621 Acute and chronic respiratory failure with hypoxia: Secondary | ICD-10-CM | POA: Diagnosis not present

## 2021-02-17 DIAGNOSIS — J189 Pneumonia, unspecified organism: Secondary | ICD-10-CM | POA: Diagnosis not present

## 2021-02-17 LAB — CBC
HCT: 23.2 % — ABNORMAL LOW (ref 39.0–52.0)
Hemoglobin: 7.4 g/dL — ABNORMAL LOW (ref 13.0–17.0)
MCH: 29.5 pg (ref 26.0–34.0)
MCHC: 31.9 g/dL (ref 30.0–36.0)
MCV: 92.4 fL (ref 80.0–100.0)
Platelets: 297 10*3/uL (ref 150–400)
RBC: 2.51 MIL/uL — ABNORMAL LOW (ref 4.22–5.81)
RDW: 15.4 % (ref 11.5–15.5)
WBC: 10 10*3/uL (ref 4.0–10.5)
nRBC: 0.7 % — ABNORMAL HIGH (ref 0.0–0.2)

## 2021-02-17 LAB — CULTURE, RESPIRATORY W GRAM STAIN

## 2021-02-17 LAB — BASIC METABOLIC PANEL
Anion gap: 7 (ref 5–15)
BUN: 81 mg/dL — ABNORMAL HIGH (ref 8–23)
CO2: 27 mmol/L (ref 22–32)
Calcium: 8.2 mg/dL — ABNORMAL LOW (ref 8.9–10.3)
Chloride: 110 mmol/L (ref 98–111)
Creatinine, Ser: 0.85 mg/dL (ref 0.61–1.24)
GFR, Estimated: >60 mL/min (ref >60)
Glucose, Bld: 228 mg/dL — ABNORMAL HIGH (ref 70–99)
Potassium: 4.4 mmol/L (ref 3.5–5.1)
Sodium: 144 mmol/L (ref 135–145)

## 2021-02-17 LAB — TROPONIN I (HIGH SENSITIVITY)
Troponin I (High Sensitivity): 24 ng/L — ABNORMAL HIGH (ref <18)
Troponin I (High Sensitivity): 24 ng/L — ABNORMAL HIGH (ref <18)

## 2021-02-17 LAB — MAGNESIUM: Magnesium: 2.2 mg/dL (ref 1.7–2.4)

## 2021-02-17 NOTE — Progress Notes (Signed)
Pulmonary Critical Care Medicine [facility] [place]   PULMONARY CRITICAL CARE SERVICE  PROGRESS NOTE     Julian Johnson  FXT:[id number]  DOB: [birth year]   DOA: 02/08/2021  Referring Physician: Carron Curie, MD  HPI: Julian Johnson is a 80 y.o. male being followed for ventilator/airway/oxygen weaning Acute on Chronic Respiratory Failure.  Patient is on pressure support comfortable right now without distress was able to complete 12 hours weaning yesterday  Medications: Reviewed on Rounds  Physical Exam:  Vitals: Temperature is 97.0 pulse 78 respiratory rate 16 blood pressure is 122/61 saturations 94%  Ventilator Settings on pressure support FiO2 35% pressure 12/7  General: Comfortable at this time Neck: supple Cardiovascular: no malignant arrhythmias Respiratory: No rhonchi very coarse breath sounds Skin: no rash seen on limited exam Musculoskeletal: No gross abnormality Psychiatric:unable to assess Neurologic:no involuntary movements         Lab Data:   Basic Metabolic Panel: Recent Labs  Lab 02/11/21 0358 02/14/21 1122 02/16/21 0322  NA  --  143 144  K 4.3 3.8 4.2  CL  --  104 107  CO2  --  31 30  GLUCOSE  --  212* 200*  BUN  --  73* 88*  CREATININE  --  0.88 0.89  CALCIUM  --  8.2* 8.1*  MG  --  2.3 2.2    ABG: Recent Labs  Lab 02/16/21 1310  PHART 7.411  PCO2ART 45.8  PO2ART 62.6*  HCO3 28.5*  O2SAT 92.0    Liver Function Tests: No results for input(s): AST, ALT, ALKPHOS, BILITOT, PROT, ALBUMIN in the last 168 hours. No results for input(s): LIPASE, AMYLASE in the last 168 hours. No results for input(s): AMMONIA in the last 168 hours.  CBC: Recent Labs  Lab 02/11/21 0358 02/14/21 1122 02/16/21 0322 02/17/21 0429  WBC 11.1* 10.1 9.1 10.0  HGB 7.6* 7.1* 6.3* 7.4*  HCT 24.1* 22.6* 20.2* 23.2*  MCV 94.9 93.8 94.4 92.4  PLT 264 248 273 297    Cardiac Enzymes: No results for input(s): CKTOTAL, CKMB, CKMBINDEX, TROPONINI in the last  168 hours.  BNP (last 3 results) No results for input(s): BNP in the last 8760 hours.  ProBNP (last 3 results) No results for input(s): PROBNP in the last 8760 hours.  Radiological Exams: No results found.  Assessment/Plan Active Problems:   Acute on chronic respiratory failure with hypoxia (HCC)   Acute ischemic right MCA stroke (HCC)   C5 cervical fracture (HCC)   Healthcare-associated pneumonia   Tracheostomy hemorrhage (HCC)   Acute on chronic respiratory failure hypoxia we will continue with the weaning protocol patient is supposed to do 16 hours today Acute stroke no change supportive care C5 cervical fracture patient is at baseline Healthcare associated pneumonia treated slowly improving Tracheal hemorrhage resolved   I have personally seen and evaluated the patient, evaluated laboratory and imaging results, formulated the assessment and plan and placed orders. The Patient requires high complexity decision making with multiple systems involvement.  Rounds were done with the Respiratory Therapy Director and Staff therapists and discussed with nursing staff also.  Yevonne Pax, MD [facility] Pulmonary Critical Care Medicine Sleep Medicine

## 2021-02-18 ENCOUNTER — Other Ambulatory Visit (HOSPITAL_COMMUNITY): Payer: No Typology Code available for payment source

## 2021-02-18 DIAGNOSIS — I63511 Cerebral infarction due to unspecified occlusion or stenosis of right middle cerebral artery: Secondary | ICD-10-CM | POA: Diagnosis not present

## 2021-02-18 DIAGNOSIS — S12401S Unspecified nondisplaced fracture of fifth cervical vertebra, sequela: Secondary | ICD-10-CM | POA: Diagnosis not present

## 2021-02-18 DIAGNOSIS — J189 Pneumonia, unspecified organism: Secondary | ICD-10-CM | POA: Diagnosis not present

## 2021-02-18 DIAGNOSIS — J9621 Acute and chronic respiratory failure with hypoxia: Secondary | ICD-10-CM | POA: Diagnosis not present

## 2021-02-18 LAB — CBC
HCT: 22.9 % — ABNORMAL LOW (ref 39.0–52.0)
Hemoglobin: 7 g/dL — ABNORMAL LOW (ref 13.0–17.0)
MCH: 29.2 pg (ref 26.0–34.0)
MCHC: 30.6 g/dL (ref 30.0–36.0)
MCV: 95.4 fL (ref 80.0–100.0)
Platelets: 355 10*3/uL (ref 150–400)
RBC: 2.4 MIL/uL — ABNORMAL LOW (ref 4.22–5.81)
RDW: 15.9 % — ABNORMAL HIGH (ref 11.5–15.5)
WBC: 10.1 10*3/uL (ref 4.0–10.5)
nRBC: 0 % (ref 0.0–0.2)

## 2021-02-18 LAB — BASIC METABOLIC PANEL
Anion gap: 7 (ref 5–15)
BUN: 87 mg/dL — ABNORMAL HIGH (ref 8–23)
CO2: 27 mmol/L (ref 22–32)
Calcium: 8.5 mg/dL — ABNORMAL LOW (ref 8.9–10.3)
Chloride: 109 mmol/L (ref 98–111)
Creatinine, Ser: 0.87 mg/dL (ref 0.61–1.24)
GFR, Estimated: >60 mL/min (ref >60)
Glucose, Bld: 144 mg/dL — ABNORMAL HIGH (ref 70–99)
Potassium: 4.7 mmol/L (ref 3.5–5.1)
Sodium: 143 mmol/L (ref 135–145)

## 2021-02-18 LAB — PHOSPHORUS: Phosphorus: 2.2 mg/dL — ABNORMAL LOW (ref 2.5–4.6)

## 2021-02-18 LAB — MAGNESIUM: Magnesium: 2.2 mg/dL (ref 1.7–2.4)

## 2021-02-18 LAB — PREPARE RBC (CROSSMATCH)

## 2021-02-18 NOTE — Progress Notes (Signed)
Pulmonary Critical Care Medicine [facility] [place]   PULMONARY CRITICAL CARE SERVICE  PROGRESS NOTE     Julian Johnson  [id number]  DOB: 05/24/1941   DOA: 02/26/2021  Referring Physician: Carron Curie, MD  HPI: Julian Johnson is a 80 y.o. male being followed for ventilator/airway/oxygen weaning Acute on Chronic Respiratory Failure.  Patient has been gradually weaning supposed to do pressure support  Medications: Reviewed on Rounds  Physical Exam:  Vitals: Temperature is 97.3 pulse 78 respiratory rate is 30 blood pressure is 131/97 saturations 94%  Ventilator Settings on assist control FiO2 is 35% PEEP 7 tidal volume 530  General: Comfortable at this time Neck: supple Cardiovascular: no malignant arrhythmias Respiratory: Coarse breath sounds with few scattered rhonchi Skin: no rash seen on limited exam Musculoskeletal: No gross abnormality Psychiatric:unable to assess Neurologic:no involuntary movements         Lab Data:   Basic Metabolic Panel: Recent Labs  Lab 02/14/21 1122 02/16/21 0322 02/17/21 1156 02/18/21 0413  NA 143 144 144 143  K 3.8 4.2 4.4 4.7  CL 104 107 110 109  CO2 31 30 27 27   GLUCOSE 212* 200* 228* 144*  BUN 73* 88* 81* 87*  CREATININE 0.88 0.89 0.85 0.87  CALCIUM 8.2* 8.1* 8.2* 8.5*  MG 2.3 2.2 2.2 2.2  PHOS  --   --   --  2.2*    ABG: Recent Labs  Lab 02/16/21 1310  PHART 7.411  PCO2ART 45.8  PO2ART 62.6*  HCO3 28.5*  O2SAT 92.0    Liver Function Tests: No results for input(s): AST, ALT, ALKPHOS, BILITOT, PROT, ALBUMIN in the last 168 hours. No results for input(s): LIPASE, AMYLASE in the last 168 hours. No results for input(s): AMMONIA in the last 168 hours.  CBC: Recent Labs  Lab 02/14/21 1122 02/16/21 0322 02/17/21 0429 02/18/21 0413  WBC 10.1 9.1 10.0 10.1  HGB 7.1* 6.3* 7.4* 7.0*  HCT 22.6* 20.2* 23.2* 22.9*  MCV 93.8 94.4 92.4 95.4  PLT 248 273 297 355    Cardiac Enzymes: No results for input(s):  CKTOTAL, CKMB, CKMBINDEX, TROPONINI in the last 168 hours.  BNP (last 3 results) No results for input(s): BNP in the last 8760 hours.  ProBNP (last 3 results) No results for input(s): PROBNP in the last 8760 hours.  Radiological Exams: DG CHEST PORT 1 VIEW  Result Date: 02/18/2021 CLINICAL DATA:  Respiratory failure.  Pneumonia EXAM: PORTABLE CHEST 1 VIEW COMPARISON:  Four days ago FINDINGS: Tracheostomy tube remains in place. Right neck stent, presumably in the carotid. Unchanged patchy bilateral infiltrate greatest at the left base where there is also pleural fluid. Stable heart size and mediastinal contours. No pneumothorax. IMPRESSION: Stable bilateral pneumonia and small left pleural effusion. Electronically Signed   By: 02/20/2021 M.D.   On: 02/18/2021 05:55    Assessment/Plan Active Problems:   Acute on chronic respiratory failure with hypoxia (HCC)   Acute ischemic right MCA stroke (HCC)   C5 cervical fracture (HCC)   Healthcare-associated pneumonia   Tracheostomy hemorrhage (HCC)   Acute on chronic respiratory failure hypoxia at this time patient is going to be continued on pressure support yesterday was able to do 3 hours try to advance further today Acute stroke no change we will continue with supportive care C5 fracture no change we will continue to follow along Healthcare associated pneumonia treated slowly improving Tracheal hemorrhage patient is at baseline no active bleeding   I have personally seen and evaluated the  patient, evaluated laboratory and imaging results, formulated the assessment and plan and placed orders. The Patient requires high complexity decision making with multiple systems involvement.  Rounds were done with the Respiratory Therapy Director and Staff therapists and discussed with nursing staff also.  Allyne Gee, MD [facility] Pulmonary Critical Care Medicine Sleep Medicine

## 2021-02-19 DIAGNOSIS — I63511 Cerebral infarction due to unspecified occlusion or stenosis of right middle cerebral artery: Secondary | ICD-10-CM | POA: Diagnosis not present

## 2021-02-19 DIAGNOSIS — J9621 Acute and chronic respiratory failure with hypoxia: Secondary | ICD-10-CM | POA: Diagnosis not present

## 2021-02-19 DIAGNOSIS — J189 Pneumonia, unspecified organism: Secondary | ICD-10-CM | POA: Diagnosis not present

## 2021-02-19 DIAGNOSIS — S12401S Unspecified nondisplaced fracture of fifth cervical vertebra, sequela: Secondary | ICD-10-CM | POA: Diagnosis not present

## 2021-02-19 LAB — CBC
HCT: 22.5 % — ABNORMAL LOW (ref 39.0–52.0)
Hemoglobin: 7.2 g/dL — ABNORMAL LOW (ref 13.0–17.0)
MCH: 29 pg (ref 26.0–34.0)
MCHC: 32 g/dL (ref 30.0–36.0)
MCV: 90.7 fL (ref 80.0–100.0)
Platelets: 380 10*3/uL (ref 150–400)
RBC: 2.48 MIL/uL — ABNORMAL LOW (ref 4.22–5.81)
RDW: 16.6 % — ABNORMAL HIGH (ref 11.5–15.5)
WBC: 10.8 10*3/uL — ABNORMAL HIGH (ref 4.0–10.5)
nRBC: 0 % (ref 0.0–0.2)

## 2021-02-19 LAB — RENAL FUNCTION PANEL
Albumin: 1.2 g/dL — ABNORMAL LOW (ref 3.5–5.0)
Anion gap: 6 (ref 5–15)
BUN: 90 mg/dL — ABNORMAL HIGH (ref 8–23)
CO2: 26 mmol/L (ref 22–32)
Calcium: 8.4 mg/dL — ABNORMAL LOW (ref 8.9–10.3)
Chloride: 110 mmol/L (ref 98–111)
Creatinine, Ser: 0.9 mg/dL (ref 0.61–1.24)
GFR, Estimated: >60 mL/min (ref >60)
Glucose, Bld: 186 mg/dL — ABNORMAL HIGH (ref 70–99)
Phosphorus: 1.9 mg/dL — ABNORMAL LOW (ref 2.5–4.6)
Potassium: 4.3 mmol/L (ref 3.5–5.1)
Sodium: 142 mmol/L (ref 135–145)

## 2021-02-19 LAB — BPAM RBC
Blood Product Expiration Date: 202208182359
Blood Product Expiration Date: 202208232359
ISSUE DATE / TIME: 202208181008
ISSUE DATE / TIME: 202208191143
Unit Type and Rh: 9500
Unit Type and Rh: 9500

## 2021-02-19 LAB — TYPE AND SCREEN
ABO/RH(D): O NEG
Antibody Screen: NEGATIVE
Unit division: 0
Unit division: 0

## 2021-02-19 LAB — OCCULT BLOOD X 1 CARD TO LAB, STOOL: Fecal Occult Bld: NEGATIVE

## 2021-02-19 LAB — MAGNESIUM: Magnesium: 2.2 mg/dL (ref 1.7–2.4)

## 2021-02-19 NOTE — Progress Notes (Signed)
Pulmonary Critical Care Medicine [facility] [place]   PULMONARY CRITICAL CARE SERVICE  PROGRESS NOTE     Julian Johnson  [id number]  DOB: [birth year]   DOA: 02/03/2021  Referring Physician: Luna Kitchens, MD  HPI: Julian Johnson is a 81 y.o. male being followed for ventilator/airway/oxygen weaning Acute on Chronic Respiratory Failure.  Patient is comfortable right now without distress at this time remains on pressure support supposed to wean as tolerated today  Medications: Reviewed on Rounds  Physical Exam:  Vitals: Temperature 97.2 pulse 85 respiratory 29 blood pressure is 129/66 saturations 94%  Ventilator Settings on pressure support 12/5 FiO2 is 35%  General: Comfortable at this time Neck: supple Cardiovascular: no malignant arrhythmias Respiratory: No rhonchi very coarse breath sound Skin: no rash seen on limited exam Musculoskeletal: No gross abnormality Psychiatric:unable to assess Neurologic:no involuntary movements         Lab Data:   Basic Metabolic Panel: Recent Labs  Lab 02/14/21 1122 02/16/21 0322 02/17/21 1156 02/18/21 0413 02/19/21 0336  NA 143 144 144 143 142  K 3.8 4.2 4.4 4.7 4.3  CL 104 107 110 109 110  CO2 31 30 27 27 26   GLUCOSE 212* 200* 228* 144* 186*  BUN 73* 88* 81* 87* 90*  CREATININE 0.88 0.89 0.85 0.87 0.90  CALCIUM 8.2* 8.1* 8.2* 8.5* 8.4*  MG 2.3 2.2 2.2 2.2 2.2  PHOS  --   --   --  2.2* 1.9*    ABG: Recent Labs  Lab 02/16/21 1310  PHART 7.411  PCO2ART 45.8  PO2ART 62.6*  HCO3 28.5*  O2SAT 92.0    Liver Function Tests: Recent Labs  Lab 02/19/21 0336  ALBUMIN 1.2*   No results for input(s): LIPASE, AMYLASE in the last 168 hours. No results for input(s): AMMONIA in the last 168 hours.  CBC: Recent Labs  Lab 02/14/21 1122 02/16/21 0322 02/17/21 0429 02/18/21 0413 02/19/21 0336  WBC 10.1 9.1 10.0 10.1 10.8*  HGB 7.1* 6.3* 7.4* 7.0* 7.2*  HCT 22.6* 20.2* 23.2* 22.9* 22.5*  MCV 93.8 94.4 92.4  95.4 90.7  PLT 248 273 297 355 380    Cardiac Enzymes: No results for input(s): CKTOTAL, CKMB, CKMBINDEX, TROPONINI in the last 168 hours.  BNP (last 3 results) No results for input(s): BNP in the last 8760 hours.  ProBNP (last 3 results) No results for input(s): PROBNP in the last 8760 hours.  Radiological Exams: DG CHEST PORT 1 VIEW  Result Date: 02/18/2021 CLINICAL DATA:  Respiratory failure.  Pneumonia EXAM: PORTABLE CHEST 1 VIEW COMPARISON:  Four days ago FINDINGS: Tracheostomy tube remains in place. Right neck stent, presumably in the carotid. Unchanged patchy bilateral infiltrate greatest at the left base where there is also pleural fluid. Stable heart size and mediastinal contours. No pneumothorax. IMPRESSION: Stable bilateral pneumonia and small left pleural effusion. Electronically Signed   By: 02/20/2021 M.D.   On: 02/18/2021 05:55    Assessment/Plan Active Problems:   Acute on chronic respiratory failure with hypoxia (HCC)   Acute ischemic right MCA stroke (HCC)   C5 cervical fracture (HCC)   Healthcare-associated pneumonia   Tracheostomy hemorrhage (HCC)   Acute on chronic respiratory failure hypoxia continue with pressure support 25% FiO2 continue pulmonary toilet supportive care. Acute stroke no change continue present therapy C5 fracture supportive care Healthcare associated pneumonia is treated Tracheal hemorrhage no sign of active bleeding at this time   I have personally seen and evaluated the patient, evaluated laboratory and imaging  results, formulated the assessment and plan and placed orders. The Patient requires high complexity decision making with multiple systems involvement.  Rounds were done with the Respiratory Therapy Director and Staff therapists and discussed with nursing staff also.  Allyne Gee, MD [facility] Pulmonary Critical Care Medicine Sleep Medicine

## 2021-02-20 LAB — CBC
HCT: 23.8 % — ABNORMAL LOW (ref 39.0–52.0)
Hemoglobin: 7.4 g/dL — ABNORMAL LOW (ref 13.0–17.0)
MCH: 29.4 pg (ref 26.0–34.0)
MCHC: 31.1 g/dL (ref 30.0–36.0)
MCV: 94.4 fL (ref 80.0–100.0)
Platelets: 391 K/uL (ref 150–400)
RBC: 2.52 MIL/uL — ABNORMAL LOW (ref 4.22–5.81)
RDW: 16.4 % — ABNORMAL HIGH (ref 11.5–15.5)
WBC: 11.3 K/uL — ABNORMAL HIGH (ref 4.0–10.5)
nRBC: 0 % (ref 0.0–0.2)

## 2021-02-21 ENCOUNTER — Other Ambulatory Visit (HOSPITAL_COMMUNITY): Payer: No Typology Code available for payment source

## 2021-02-21 DIAGNOSIS — I63511 Cerebral infarction due to unspecified occlusion or stenosis of right middle cerebral artery: Secondary | ICD-10-CM | POA: Diagnosis not present

## 2021-02-21 DIAGNOSIS — J189 Pneumonia, unspecified organism: Secondary | ICD-10-CM | POA: Diagnosis not present

## 2021-02-21 DIAGNOSIS — S12401S Unspecified nondisplaced fracture of fifth cervical vertebra, sequela: Secondary | ICD-10-CM | POA: Diagnosis not present

## 2021-02-21 DIAGNOSIS — J9621 Acute and chronic respiratory failure with hypoxia: Secondary | ICD-10-CM | POA: Diagnosis not present

## 2021-02-21 LAB — BASIC METABOLIC PANEL
Anion gap: 8 (ref 5–15)
BUN: 86 mg/dL — ABNORMAL HIGH (ref 8–23)
CO2: 25 mmol/L (ref 22–32)
Calcium: 8.7 mg/dL — ABNORMAL LOW (ref 8.9–10.3)
Chloride: 111 mmol/L (ref 98–111)
Creatinine, Ser: 0.93 mg/dL (ref 0.61–1.24)
GFR, Estimated: >60 mL/min (ref >60)
Glucose, Bld: 163 mg/dL — ABNORMAL HIGH (ref 70–99)
Potassium: 4.5 mmol/L (ref 3.5–5.1)
Sodium: 144 mmol/L (ref 135–145)

## 2021-02-21 LAB — MAGNESIUM: Magnesium: 2 mg/dL (ref 1.7–2.4)

## 2021-02-21 LAB — CBC
HCT: 25.6 % — ABNORMAL LOW (ref 39.0–52.0)
Hemoglobin: 7.8 g/dL — ABNORMAL LOW (ref 13.0–17.0)
MCH: 28.3 pg (ref 26.0–34.0)
MCHC: 30.5 g/dL (ref 30.0–36.0)
MCV: 92.8 fL (ref 80.0–100.0)
Platelets: 431 10*3/uL — ABNORMAL HIGH (ref 150–400)
RBC: 2.76 MIL/uL — ABNORMAL LOW (ref 4.22–5.81)
RDW: 16.4 % — ABNORMAL HIGH (ref 11.5–15.5)
WBC: 11.4 10*3/uL — ABNORMAL HIGH (ref 4.0–10.5)
nRBC: 0 % (ref 0.0–0.2)

## 2021-02-21 LAB — PHOSPHORUS: Phosphorus: 2.6 mg/dL (ref 2.5–4.6)

## 2021-02-21 NOTE — Progress Notes (Signed)
Pulmonary Critical Care Medicine [facility] [place]   PULMONARY CRITICAL CARE SERVICE  PROGRESS NOTE     Julian Johnson  [id number]  DOB: [birth year]   DOA: 02/22/2021  Referring Physician: Luna Kitchens, MD  HPI: Julian Johnson is a 80 y.o. male being followed for ventilator/airway/oxygen weaning Acute on Chronic Respiratory Failure.  Patient is resting comfortably right now without distress has been weaning on pressure support doing okay so far we will continue to advancing slowly  Medications: Reviewed on Rounds  Physical Exam:  Vitals: Temperature is 96.4 pulse 85 respiratory rate is 27 blood pressure is 139/65 saturations 96%  Ventilator Settings on pressure support FiO2 35% pressure 12/7  General: Comfortable at this time Neck: supple Cardiovascular: no malignant arrhythmias Respiratory: No rhonchi very coarse breath sounds Skin: no rash seen on limited exam Musculoskeletal: No gross abnormality Psychiatric:unable to assess Neurologic:no involuntary movements         Lab Data:   Basic Metabolic Panel: Recent Labs  Lab 02/14/21 1122 02/16/21 0322 02/17/21 1156 02/18/21 0413 02/19/21 0336  NA 143 144 144 143 142  K 3.8 4.2 4.4 4.7 4.3  CL 104 107 110 109 110  CO2 31 30 27 27 26   GLUCOSE 212* 200* 228* 144* 186*  BUN 73* 88* 81* 87* 90*  CREATININE 0.88 0.89 0.85 0.87 0.90  CALCIUM 8.2* 8.1* 8.2* 8.5* 8.4*  MG 2.3 2.2 2.2 2.2 2.2  PHOS  --   --   --  2.2* 1.9*    ABG: Recent Labs  Lab 02/16/21 1310  PHART 7.411  PCO2ART 45.8  PO2ART 62.6*  HCO3 28.5*  O2SAT 92.0    Liver Function Tests: Recent Labs  Lab 02/19/21 0336  ALBUMIN 1.2*   No results for input(s): LIPASE, AMYLASE in the last 168 hours. No results for input(s): AMMONIA in the last 168 hours.  CBC: Recent Labs  Lab 02/16/21 0322 02/17/21 0429 02/18/21 0413 02/19/21 0336 02/20/21 0500  WBC 9.1 10.0 10.1 10.8* 11.3*  HGB 6.3* 7.4* 7.0* 7.2* 7.4*  HCT 20.2*  23.2* 22.9* 22.5* 23.8*  MCV 94.4 92.4 95.4 90.7 94.4  PLT 273 297 355 380 391    Cardiac Enzymes: No results for input(s): CKTOTAL, CKMB, CKMBINDEX, TROPONINI in the last 168 hours.  BNP (last 3 results) No results for input(s): BNP in the last 8760 hours.  ProBNP (last 3 results) No results for input(s): PROBNP in the last 8760 hours.  Radiological Exams: DG CHEST PORT 1 VIEW  Result Date: 02/21/2021 CLINICAL DATA:  80 year old male with history of pneumonia. EXAM: PORTABLE CHEST 1 VIEW COMPARISON:  Chest x-ray 02/18/2021. FINDINGS: A tracheostomy tube is in place with tip 4.3 cm above the carina. Vascular stent projecting over the lower right cervical region. Multifocal ill-defined opacities and areas of interstitial prominence are again noted throughout the lungs bilaterally, most confluent in the mid to lower lungs, particularly in the medial aspect of the left lung base. Small left pleural effusion. No definite right pleural effusion. In the inferior aspect of the right upper lobe there is a 1.1 cm nodular density, favored to represent a calcified granuloma. Adjacent to this, there is an increasingly conspicuous rounded area which may represent a worsening area of airspace consolidation. No evidence of pulmonary edema. No pneumothorax. Heart size is normal. The patient is rotated to the left on today's exam, resulting in distortion of the mediastinal contours and reduced diagnostic sensitivity and specificity for mediastinal pathology. Atherosclerotic calcifications in the thoracic  aorta. IMPRESSION: 1. Support apparatus, as above. 2. Slight worsening multilobar bilateral pneumonia, as above. 3. Aortic atherosclerosis. Electronically Signed   By: Trudie Reed M.D.   On: 02/21/2021 06:30    Assessment/Plan Active Problems:   Acute on chronic respiratory failure with hypoxia (HCC)   Acute ischemic right MCA stroke (HCC)   C5 cervical fracture (HCC)   Healthcare-associated pneumonia    Tracheostomy hemorrhage (HCC)   Acute on chronic respiratory failure with hypoxia the plan is going to be to continue with the weaning protocol on pressure support right now pressure is 12/7 on 35% FiO2 we will continue to advance Acute stroke no change we will continue with supportive care therapy as tolerated C5 fracture stable remains with neck collar in place Tracheal hemorrhage no further bleeding is noted Healthcare associated pneumonia treated some slight worsening of the pneumonia was noted could be representing more atelectasis   I have personally seen and evaluated the patient, evaluated laboratory and imaging results, formulated the assessment and plan and placed orders. The Patient requires high complexity decision making with multiple systems involvement.  Rounds were done with the Respiratory Therapy Director and Staff therapists and discussed with nursing staff also.  Yevonne Pax, MD [facility] Pulmonary Critical Care Medicine Sleep Medicine

## 2021-02-22 DIAGNOSIS — J189 Pneumonia, unspecified organism: Secondary | ICD-10-CM | POA: Diagnosis not present

## 2021-02-22 DIAGNOSIS — S12401S Unspecified nondisplaced fracture of fifth cervical vertebra, sequela: Secondary | ICD-10-CM | POA: Diagnosis not present

## 2021-02-22 DIAGNOSIS — I63511 Cerebral infarction due to unspecified occlusion or stenosis of right middle cerebral artery: Secondary | ICD-10-CM | POA: Diagnosis not present

## 2021-02-22 DIAGNOSIS — J9621 Acute and chronic respiratory failure with hypoxia: Secondary | ICD-10-CM | POA: Diagnosis not present

## 2021-02-22 NOTE — Progress Notes (Signed)
Pulmonary Critical Care Medicine [facility] [place]   PULMONARY CRITICAL CARE SERVICE  PROGRESS NOTE     Julian Johnson  [id number]  DOB: 06/07/1941   DOA: 03-01-2021  Referring Physician: Luna Kitchens, MD  HPI: Julian Johnson is a 80 y.o. male being followed for ventilator/airway/oxygen weaning Acute on Chronic Respiratory Failure.  Patient is weaning on protocol right now on pressure support 12/5 seems to be tolerating it well  Medications: Reviewed on Rounds  Physical Exam:  Vitals: Temperature is 97.2 pulse 86 respiratory rate is 27 blood pressure is 105/60 saturations 97%  Ventilator Settings on pressure support FiO2 35% pressure of 12/5  General: Comfortable at this time Neck: supple Cardiovascular: no malignant arrhythmias Respiratory: No rhonchi very coarse breath sounds Skin: no rash seen on limited exam Musculoskeletal: No gross abnormality Psychiatric:unable to assess Neurologic:no involuntary movements         Lab Data:   Basic Metabolic Panel: Recent Labs  Lab 02/16/21 0322 02/17/21 1156 02/18/21 0413 02/19/21 0336 02/21/21 0938  NA 144 144 143 142 144  K 4.2 4.4 4.7 4.3 4.5  CL 107 110 109 110 111  CO2 30 27 27 26 25   GLUCOSE 200* 228* 144* 186* 163*  BUN 88* 81* 87* 90* 86*  CREATININE 0.89 0.85 0.87 0.90 0.93  CALCIUM 8.1* 8.2* 8.5* 8.4* 8.7*  MG 2.2 2.2 2.2 2.2 2.0  PHOS  --   --  2.2* 1.9* 2.6    ABG: Recent Labs  Lab 02/16/21 1310  PHART 7.411  PCO2ART 45.8  PO2ART 62.6*  HCO3 28.5*  O2SAT 92.0    Liver Function Tests: Recent Labs  Lab 02/19/21 0336  ALBUMIN 1.2*   No results for input(s): LIPASE, AMYLASE in the last 168 hours. No results for input(s): AMMONIA in the last 168 hours.  CBC: Recent Labs  Lab 02/17/21 0429 02/18/21 0413 02/19/21 0336 02/20/21 0500 02/21/21 0938  WBC 10.0 10.1 10.8* 11.3* 11.4*  HGB 7.4* 7.0* 7.2* 7.4* 7.8*  HCT 23.2* 22.9* 22.5* 23.8* 25.6*  MCV 92.4 95.4 90.7 94.4  92.8  PLT 297 355 380 391 431*    Cardiac Enzymes: No results for input(s): CKTOTAL, CKMB, CKMBINDEX, TROPONINI in the last 168 hours.  BNP (last 3 results) No results for input(s): BNP in the last 8760 hours.  ProBNP (last 3 results) No results for input(s): PROBNP in the last 8760 hours.  Radiological Exams: DG CHEST PORT 1 VIEW  Result Date: 02/21/2021 CLINICAL DATA:  80 year old male with history of pneumonia. EXAM: PORTABLE CHEST 1 VIEW COMPARISON:  Chest x-ray 02/18/2021. FINDINGS: A tracheostomy tube is in place with tip 4.3 cm above the carina. Vascular stent projecting over the lower right cervical region. Multifocal ill-defined opacities and areas of interstitial prominence are again noted throughout the lungs bilaterally, most confluent in the mid to lower lungs, particularly in the medial aspect of the left lung base. Small left pleural effusion. No definite right pleural effusion. In the inferior aspect of the right upper lobe there is a 1.1 cm nodular density, favored to represent a calcified granuloma. Adjacent to this, there is an increasingly conspicuous rounded area which may represent a worsening area of airspace consolidation. No evidence of pulmonary edema. No pneumothorax. Heart size is normal. The patient is rotated to the left on today's exam, resulting in distortion of the mediastinal contours and reduced diagnostic sensitivity and specificity for mediastinal pathology. Atherosclerotic calcifications in the thoracic aorta. IMPRESSION: 1. Support apparatus, as above. 2.  Slight worsening multilobar bilateral pneumonia, as above. 3. Aortic atherosclerosis. Electronically Signed   By: Trudie Reed M.D.   On: 02/21/2021 06:30    Assessment/Plan Active Problems:   Acute on chronic respiratory failure with hypoxia (HCC)   Acute ischemic right MCA stroke (HCC)   C5 cervical fracture (HCC)   Healthcare-associated pneumonia   Tracheostomy hemorrhage (HCC)   Acute on  chronic respiratory failure with hypoxia we will continue with the weaning protocol wean on pressure support as tolerated. Acute stroke no change continue with present management Healthcare associated pneumonia treated continue to follow along Tracheal hemorrhage no further bleeding is noted C5 fracture stable continue to monitor   I have personally seen and evaluated the patient, evaluated laboratory and imaging results, formulated the assessment and plan and placed orders. The Patient requires high complexity decision making with multiple systems involvement.  Rounds were done with the Respiratory Therapy Director and Staff therapists and discussed with nursing staff also.  Yevonne Pax, MD [facility] Pulmonary Critical Care Medicine Sleep Medicine

## 2021-02-23 DIAGNOSIS — S12401S Unspecified nondisplaced fracture of fifth cervical vertebra, sequela: Secondary | ICD-10-CM | POA: Diagnosis not present

## 2021-02-23 DIAGNOSIS — I63511 Cerebral infarction due to unspecified occlusion or stenosis of right middle cerebral artery: Secondary | ICD-10-CM | POA: Diagnosis not present

## 2021-02-23 DIAGNOSIS — J9621 Acute and chronic respiratory failure with hypoxia: Secondary | ICD-10-CM | POA: Diagnosis not present

## 2021-02-23 DIAGNOSIS — J189 Pneumonia, unspecified organism: Secondary | ICD-10-CM | POA: Diagnosis not present

## 2021-02-23 NOTE — Progress Notes (Signed)
Pulmonary Critical Care Medicine [facility] [place]   PULMONARY CRITICAL CARE SERVICE  PROGRESS NOTE     Quinnlan Abruzzo  [id number]  DOB: 04/10/1941   DOA: 2021-02-11  Referring Physician: Luna Kitchens, MD  HPI: Dutch Ing is a 80 y.o. male being followed for ventilator/airway/oxygen weaning Acute on Chronic Respiratory Failure.  Patient is on pressure support mode has been on 45% FiO2 currently on pressure of 12/5  Medications: Reviewed on Rounds  Physical Exam:  Vitals: Temperature is 97.6 pulse 98 respiratory rate is 27 blood pressure 149/69 saturations 96%  Ventilator Settings on pressure support FiO2 45% pressure 12/5  General: Comfortable at this time Neck: supple Cardiovascular: no malignant arrhythmias Respiratory: No rhonchi no rales are noted at this time Skin: no rash seen on limited exam Musculoskeletal: No gross abnormality Psychiatric:unable to assess Neurologic:no involuntary movements         Lab Data:   Basic Metabolic Panel: Recent Labs  Lab 02/17/21 1156 02/18/21 0413 02/19/21 0336 02/21/21 0938  NA 144 143 142 144  K 4.4 4.7 4.3 4.5  CL 110 109 110 111  CO2 27 27 26 25   GLUCOSE 228* 144* 186* 163*  BUN 81* 87* 90* 86*  CREATININE 0.85 0.87 0.90 0.93  CALCIUM 8.2* 8.5* 8.4* 8.7*  MG 2.2 2.2 2.2 2.0  PHOS  --  2.2* 1.9* 2.6    ABG: Recent Labs  Lab 02/16/21 1310  PHART 7.411  PCO2ART 45.8  PO2ART 62.6*  HCO3 28.5*  O2SAT 92.0    Liver Function Tests: Recent Labs  Lab 02/19/21 0336  ALBUMIN 1.2*   No results for input(s): LIPASE, AMYLASE in the last 168 hours. No results for input(s): AMMONIA in the last 168 hours.  CBC: Recent Labs  Lab 02/17/21 0429 02/18/21 0413 02/19/21 0336 02/20/21 0500 02/21/21 0938  WBC 10.0 10.1 10.8* 11.3* 11.4*  HGB 7.4* 7.0* 7.2* 7.4* 7.8*  HCT 23.2* 22.9* 22.5* 23.8* 25.6*  MCV 92.4 95.4 90.7 94.4 92.8  PLT 297 355 380 391 431*    Cardiac Enzymes: No results for  input(s): CKTOTAL, CKMB, CKMBINDEX, TROPONINI in the last 168 hours.  BNP (last 3 results) No results for input(s): BNP in the last 8760 hours.  ProBNP (last 3 results) No results for input(s): PROBNP in the last 8760 hours.  Radiological Exams: No results found.  Assessment/Plan Active Problems:   Acute on chronic respiratory failure with hypoxia (HCC)   Acute ischemic right MCA stroke (HCC)   C5 cervical fracture (HCC)   Healthcare-associated pneumonia   Tracheostomy hemorrhage (HCC)   Acute on chronic respiratory failure hypoxia plan is to continue with the weaning process on pressure support 12.5.  We will advance as tolerated. Acute stroke no change continue with supportive care C5 cervical fracture patient is at baseline right now Healthcare associated pneumonia treated improved Tracheal hemorrhage no further bleeding plan on removing the sutures today   I have personally seen and evaluated the patient, evaluated laboratory and imaging results, formulated the assessment and plan and placed orders. The Patient requires high complexity decision making with multiple systems involvement.  Rounds were done with the Respiratory Therapy Director and Staff therapists and discussed with nursing staff also.  02/23/21, MD [facility] Pulmonary Critical Care Medicine Sleep Medicine

## 2021-02-24 DIAGNOSIS — I63511 Cerebral infarction due to unspecified occlusion or stenosis of right middle cerebral artery: Secondary | ICD-10-CM | POA: Diagnosis not present

## 2021-02-24 DIAGNOSIS — J189 Pneumonia, unspecified organism: Secondary | ICD-10-CM | POA: Diagnosis not present

## 2021-02-24 DIAGNOSIS — J9621 Acute and chronic respiratory failure with hypoxia: Secondary | ICD-10-CM | POA: Diagnosis not present

## 2021-02-24 DIAGNOSIS — J9501 Hemorrhage from tracheostomy stoma: Secondary | ICD-10-CM | POA: Diagnosis not present

## 2021-02-24 LAB — RENAL FUNCTION PANEL
Albumin: 1.1 g/dL — ABNORMAL LOW (ref 3.5–5.0)
Anion gap: 10 (ref 5–15)
BUN: 90 mg/dL — ABNORMAL HIGH (ref 8–23)
CO2: 20 mmol/L — ABNORMAL LOW (ref 22–32)
Calcium: 8.8 mg/dL — ABNORMAL LOW (ref 8.9–10.3)
Chloride: 112 mmol/L — ABNORMAL HIGH (ref 98–111)
Creatinine, Ser: 1.1 mg/dL (ref 0.61–1.24)
GFR, Estimated: >60 mL/min (ref >60)
Glucose, Bld: 105 mg/dL — ABNORMAL HIGH (ref 70–99)
Phosphorus: 2.9 mg/dL (ref 2.5–4.6)
Potassium: 4.7 mmol/L (ref 3.5–5.1)
Sodium: 142 mmol/L (ref 135–145)

## 2021-02-24 LAB — MAGNESIUM: Magnesium: 1.9 mg/dL (ref 1.7–2.4)

## 2021-02-24 LAB — CBC
HCT: 21.3 % — ABNORMAL LOW (ref 39.0–52.0)
Hemoglobin: 6.7 g/dL — CL (ref 13.0–17.0)
MCH: 29.3 pg (ref 26.0–34.0)
MCHC: 31.5 g/dL (ref 30.0–36.0)
MCV: 93 fL (ref 80.0–100.0)
Platelets: 361 10*3/uL (ref 150–400)
RBC: 2.29 MIL/uL — ABNORMAL LOW (ref 4.22–5.81)
RDW: 16.2 % — ABNORMAL HIGH (ref 11.5–15.5)
WBC: 16.1 10*3/uL — ABNORMAL HIGH (ref 4.0–10.5)
nRBC: 0 % (ref 0.0–0.2)

## 2021-02-24 LAB — PREPARE RBC (CROSSMATCH)

## 2021-02-24 NOTE — Progress Notes (Signed)
Pulmonary Critical Care Medicine [facility] [place]   PULMONARY CRITICAL CARE SERVICE  PROGRESS NOTE     Julian Johnson  [id number]  DOB: 1940/10/22   DOA: 02-19-21  Referring Physician: Luna Kitchens, MD  HPI: Mohab Ashby is a 80 y.o. male being followed for ventilator/airway/oxygen weaning Acute on Chronic Respiratory Failure.  Patient is full support right now on pressure control mode did 2 hours of T collar yesterday  Medications: Reviewed on Rounds  Physical Exam:  Vitals: Temperature is 97.0 pulse 81 respiratory rate is 26 blood pressure 171/86 saturations 99%  Ventilator Settings on pressure support 12/5 currently with an FiO2 of 30%  General: Comfortable at this time Neck: supple Cardiovascular: no malignant arrhythmias Respiratory: No rhonchi no rales Skin: no rash seen on limited exam Musculoskeletal: No gross abnormality Psychiatric:unable to assess Neurologic:no involuntary movements         Lab Data:   Basic Metabolic Panel: Recent Labs  Lab 02/17/21 1156 02/18/21 0413 02/19/21 0336 02/21/21 0938 02/24/21 0340  NA 144 143 142 144 142  K 4.4 4.7 4.3 4.5 4.7  CL 110 109 110 111 112*  CO2 27 27 26 25  20*  GLUCOSE 228* 144* 186* 163* 105*  BUN 81* 87* 90* 86* 90*  CREATININE 0.85 0.87 0.90 0.93 1.10  CALCIUM 8.2* 8.5* 8.4* 8.7* 8.8*  MG 2.2 2.2 2.2 2.0 1.9  PHOS  --  2.2* 1.9* 2.6 2.9    ABG: No results for input(s): PHART, PCO2ART, PO2ART, HCO3, O2SAT in the last 168 hours.  Liver Function Tests: Recent Labs  Lab 02/19/21 0336 02/24/21 0340  ALBUMIN 1.2* 1.1*   No results for input(s): LIPASE, AMYLASE in the last 168 hours. No results for input(s): AMMONIA in the last 168 hours.  CBC: Recent Labs  Lab 02/18/21 0413 02/19/21 0336 02/20/21 0500 02/21/21 0938 02/24/21 0340  WBC 10.1 10.8* 11.3* 11.4* 16.1*  HGB 7.0* 7.2* 7.4* 7.8* 6.7*  HCT 22.9* 22.5* 23.8* 25.6* 21.3*  MCV 95.4 90.7 94.4 92.8 93.0  PLT 355  380 391 431* 361    Cardiac Enzymes: No results for input(s): CKTOTAL, CKMB, CKMBINDEX, TROPONINI in the last 168 hours.  BNP (last 3 results) No results for input(s): BNP in the last 8760 hours.  ProBNP (last 3 results) No results for input(s): PROBNP in the last 8760 hours.  Radiological Exams: No results found.  Assessment/Plan Active Problems:   Acute on chronic respiratory failure with hypoxia (HCC)   Acute ischemic right MCA stroke (HCC)   C5 cervical fracture (HCC)   Healthcare-associated pneumonia   Tracheostomy hemorrhage (HCC)   Acute on chronic respiratory failure hypoxia the plan is to wean on T collar today's goal should be 12 hours.  Continue to advance as tolerated titrate oxygen. Acute stroke no change continue with supportive care C5 fractures been stable Healthcare associated pneumonia treated improved Tracheal bleeding no sign of active bleeding right now   I have personally seen and evaluated the patient, evaluated laboratory and imaging results, formulated the assessment and plan and placed orders. The Patient requires high complexity decision making with multiple systems involvement.  Rounds were done with the Respiratory Therapy Director and Staff therapists and discussed with nursing staff also.  02/26/21, MD [facility] Pulmonary Critical Care Medicine Sleep Medicine

## 2021-02-25 ENCOUNTER — Other Ambulatory Visit (HOSPITAL_COMMUNITY): Payer: No Typology Code available for payment source

## 2021-02-25 DIAGNOSIS — I63511 Cerebral infarction due to unspecified occlusion or stenosis of right middle cerebral artery: Secondary | ICD-10-CM | POA: Diagnosis not present

## 2021-02-25 DIAGNOSIS — J9501 Hemorrhage from tracheostomy stoma: Secondary | ICD-10-CM | POA: Diagnosis not present

## 2021-02-25 DIAGNOSIS — J9621 Acute and chronic respiratory failure with hypoxia: Secondary | ICD-10-CM | POA: Diagnosis not present

## 2021-02-25 DIAGNOSIS — J189 Pneumonia, unspecified organism: Secondary | ICD-10-CM | POA: Diagnosis not present

## 2021-02-25 LAB — TYPE AND SCREEN
ABO/RH(D): O NEG
Antibody Screen: NEGATIVE
Unit division: 0

## 2021-02-25 LAB — CBC
HCT: 28.2 % — ABNORMAL LOW (ref 39.0–52.0)
Hemoglobin: 8.8 g/dL — ABNORMAL LOW (ref 13.0–17.0)
MCH: 28.2 pg (ref 26.0–34.0)
MCHC: 31.2 g/dL (ref 30.0–36.0)
MCV: 90.4 fL (ref 80.0–100.0)
Platelets: 418 10*3/uL — ABNORMAL HIGH (ref 150–400)
RBC: 3.12 MIL/uL — ABNORMAL LOW (ref 4.22–5.81)
RDW: 17.1 % — ABNORMAL HIGH (ref 11.5–15.5)
WBC: 13.8 10*3/uL — ABNORMAL HIGH (ref 4.0–10.5)
nRBC: 0 % (ref 0.0–0.2)

## 2021-02-25 LAB — BLOOD GAS, ARTERIAL
Acid-base deficit: 2.4 mmol/L — ABNORMAL HIGH (ref 0.0–2.0)
Bicarbonate: 21.7 mmol/L (ref 20.0–28.0)
FIO2: 40
O2 Saturation: 98.9 %
Patient temperature: 37
pCO2 arterial: 35.9 mmHg (ref 32.0–48.0)
pH, Arterial: 7.398 (ref 7.350–7.450)
pO2, Arterial: 116 mmHg — ABNORMAL HIGH (ref 83.0–108.0)

## 2021-02-25 LAB — MAGNESIUM: Magnesium: 2 mg/dL (ref 1.7–2.4)

## 2021-02-25 LAB — RENAL FUNCTION PANEL
Albumin: 1 g/dL — ABNORMAL LOW (ref 3.5–5.0)
Anion gap: 11 (ref 5–15)
BUN: 91 mg/dL — ABNORMAL HIGH (ref 8–23)
CO2: 22 mmol/L (ref 22–32)
Calcium: 8.9 mg/dL (ref 8.9–10.3)
Chloride: 110 mmol/L (ref 98–111)
Creatinine, Ser: 1.26 mg/dL — ABNORMAL HIGH (ref 0.61–1.24)
GFR, Estimated: 58 mL/min — ABNORMAL LOW (ref >60)
Glucose, Bld: 143 mg/dL — ABNORMAL HIGH (ref 70–99)
Phosphorus: 3.1 mg/dL (ref 2.5–4.6)
Potassium: 4.1 mmol/L (ref 3.5–5.1)
Sodium: 143 mmol/L (ref 135–145)

## 2021-02-25 LAB — BPAM RBC
Blood Product Expiration Date: 202209082359
ISSUE DATE / TIME: 202208250842
Unit Type and Rh: 9500

## 2021-02-25 NOTE — Progress Notes (Signed)
Pulmonary Critical Care Medicine [facility] [place]   PULMONARY CRITICAL CARE SERVICE  PROGRESS NOTE     Julian Johnson  PYP:[id number]  DOB: 09/08/1940   DOA: Feb 12, 2021  Referring Physician: Luna Kitchens, MD  HPI: Julian Johnson is a 80 y.o. male being followed for ventilator/airway/oxygen weaning Acute on Chronic Respiratory Failure.  Patient is on T collar right now on 40% FiO2 with good saturations  Medications: Reviewed on Rounds  Physical Exam:  Vitals: Temperature is 97.8 pulse 87 respiratory is 29 blood pressure is 165/83 saturations 95%  Ventilator Settings currently on T collar FiO2 40%  General: Comfortable at this time Neck: supple Cardiovascular: no malignant arrhythmias Respiratory: No rhonchi no rales are noted Skin: no rash seen on limited exam Musculoskeletal: No gross abnormality Psychiatric:unable to assess Neurologic:no involuntary movements         Lab Data:   Basic Metabolic Panel: Recent Labs  Lab 02/19/21 0336 02/21/21 0938 02/24/21 0340 02/25/21 0410  NA 142 144 142 143  K 4.3 4.5 4.7 4.1  CL 110 111 112* 110  CO2 26 25 20* 22  GLUCOSE 186* 163* 105* 143*  BUN 90* 86* 90* 91*  CREATININE 0.90 0.93 1.10 1.26*  CALCIUM 8.4* 8.7* 8.8* 8.9  MG 2.2 2.0 1.9  --   PHOS 1.9* 2.6 2.9 3.1    ABG: No results for input(s): PHART, PCO2ART, PO2ART, HCO3, O2SAT in the last 168 hours.  Liver Function Tests: Recent Labs  Lab 02/19/21 0336 02/24/21 0340 02/25/21 0410  ALBUMIN 1.2* 1.1* 1.0*   No results for input(s): LIPASE, AMYLASE in the last 168 hours. No results for input(s): AMMONIA in the last 168 hours.  CBC: Recent Labs  Lab 02/19/21 0336 02/20/21 0500 02/21/21 0938 02/24/21 0340 02/25/21 0410  WBC 10.8* 11.3* 11.4* 16.1* 13.8*  HGB 7.2* 7.4* 7.8* 6.7* 8.8*  HCT 22.5* 23.8* 25.6* 21.3* 28.2*  MCV 90.7 94.4 92.8 93.0 90.4  PLT 380 391 431* 361 418*    Cardiac Enzymes: No results for input(s): CKTOTAL, CKMB,  CKMBINDEX, TROPONINI in the last 168 hours.  BNP (last 3 results) No results for input(s): BNP in the last 8760 hours.  ProBNP (last 3 results) No results for input(s): PROBNP in the last 8760 hours.  Radiological Exams: DG Chest Port 1 View  Result Date: 02/25/2021 CLINICAL DATA:  80 year old male with history of pneumonia and tracheostomy. EXAM: PORTABLE CHEST 1 VIEW COMPARISON:  Chest x-ray 02/21/2021. FINDINGS: A tracheostomy tube is in place with tip 3.8 cm above the carina. Compared to the prior study there is substantially worsened patchy multifocal interstitial and airspace disease throughout the lungs bilaterally (right greater than left), indicative of progressive multilobar bilateral pneumonia. Increasing small to moderate right and small left pleural effusions. No pneumothorax. Small calcified granuloma in the inferior aspect of the right upper lobe again noted. Pulmonary vasculature does not appearing origin. Heart size is normal. Upper mediastinal contours are within normal limits allowing for patient positioning. Atherosclerotic calcifications in the thoracic aorta. Vascular stent projecting over the lower right cervical region, presumably within the right carotid artery. IMPRESSION: 1. Worsening multilobar bilateral pneumonia with increasing small to moderate right and small left parapneumonic pleural effusions. 2. Aortic atherosclerosis. Electronically Signed   By: Trudie Reed M.D.   On: 02/25/2021 05:58    Assessment/Plan Active Problems:   Acute on chronic respiratory failure with hypoxia (HCC)   Acute ischemic right MCA stroke (HCC)   C5 cervical fracture (HCC)  Healthcare-associated pneumonia   Tracheostomy hemorrhage (HCC)   Acute on chronic respiratory failure hypoxia patient is currently on T collar has been on 40% FiO2 will be completing 24 hours. Acute stroke no change we will continue with supportive care. C5 cervical fracture no change we will continue with  present therapy Healthcare associated pneumonia treated we will continue to follow along. Tracheal hemorrhage no change supportive care   I have personally seen and evaluated the patient, evaluated laboratory and imaging results, formulated the assessment and plan and placed orders. The Patient requires high complexity decision making with multiple systems involvement.  Rounds were done with the Respiratory Therapy Director and Staff therapists and discussed with nursing staff also.  Yevonne Pax, MD [facility] Pulmonary Critical Care Medicine Sleep Medicine

## 2021-02-26 ENCOUNTER — Other Ambulatory Visit (HOSPITAL_COMMUNITY): Payer: No Typology Code available for payment source

## 2021-02-26 LAB — BASIC METABOLIC PANEL
Anion gap: 8 (ref 5–15)
BUN: 104 mg/dL — ABNORMAL HIGH (ref 8–23)
CO2: 20 mmol/L — ABNORMAL LOW (ref 22–32)
Calcium: 8.4 mg/dL — ABNORMAL LOW (ref 8.9–10.3)
Chloride: 113 mmol/L — ABNORMAL HIGH (ref 98–111)
Creatinine, Ser: 1.6 mg/dL — ABNORMAL HIGH (ref 0.61–1.24)
GFR, Estimated: 43 mL/min — ABNORMAL LOW (ref >60)
Glucose, Bld: 114 mg/dL — ABNORMAL HIGH (ref 70–99)
Potassium: 4.3 mmol/L (ref 3.5–5.1)
Sodium: 141 mmol/L (ref 135–145)

## 2021-02-26 LAB — MAGNESIUM: Magnesium: 2.1 mg/dL (ref 1.7–2.4)

## 2021-02-27 ENCOUNTER — Other Ambulatory Visit (HOSPITAL_COMMUNITY): Payer: No Typology Code available for payment source

## 2021-02-27 LAB — RENAL FUNCTION PANEL
Albumin: <1 g/dL — ABNORMAL LOW (ref 3.5–5.0)
Anion gap: 12 (ref 5–15)
BUN: 110 mg/dL — ABNORMAL HIGH (ref 8–23)
CO2: 18 mmol/L — ABNORMAL LOW (ref 22–32)
Calcium: 8.3 mg/dL — ABNORMAL LOW (ref 8.9–10.3)
Chloride: 109 mmol/L (ref 98–111)
Creatinine, Ser: 1.58 mg/dL — ABNORMAL HIGH (ref 0.61–1.24)
GFR, Estimated: 44 mL/min — ABNORMAL LOW (ref >60)
Glucose, Bld: 158 mg/dL — ABNORMAL HIGH (ref 70–99)
Phosphorus: 3.5 mg/dL (ref 2.5–4.6)
Potassium: 4.5 mmol/L (ref 3.5–5.1)
Sodium: 139 mmol/L (ref 135–145)

## 2021-02-28 DIAGNOSIS — J189 Pneumonia, unspecified organism: Secondary | ICD-10-CM | POA: Diagnosis not present

## 2021-02-28 DIAGNOSIS — J9621 Acute and chronic respiratory failure with hypoxia: Secondary | ICD-10-CM | POA: Diagnosis not present

## 2021-02-28 DIAGNOSIS — J9501 Hemorrhage from tracheostomy stoma: Secondary | ICD-10-CM | POA: Diagnosis not present

## 2021-02-28 DIAGNOSIS — I63511 Cerebral infarction due to unspecified occlusion or stenosis of right middle cerebral artery: Secondary | ICD-10-CM | POA: Diagnosis not present

## 2021-02-28 LAB — COMPREHENSIVE METABOLIC PANEL
ALT: 63 U/L — ABNORMAL HIGH (ref 0–44)
AST: 68 U/L — ABNORMAL HIGH (ref 15–41)
Albumin: <1 g/dL — ABNORMAL LOW (ref 3.5–5.0)
Alkaline Phosphatase: 194 U/L — ABNORMAL HIGH (ref 38–126)
Anion gap: 10 (ref 5–15)
BUN: 116 mg/dL — ABNORMAL HIGH (ref 8–23)
CO2: 21 mmol/L — ABNORMAL LOW (ref 22–32)
Calcium: 8.4 mg/dL — ABNORMAL LOW (ref 8.9–10.3)
Chloride: 106 mmol/L (ref 98–111)
Creatinine, Ser: 1.94 mg/dL — ABNORMAL HIGH (ref 0.61–1.24)
GFR, Estimated: 34 mL/min — ABNORMAL LOW (ref >60)
Glucose, Bld: 143 mg/dL — ABNORMAL HIGH (ref 70–99)
Potassium: 4.4 mmol/L (ref 3.5–5.1)
Sodium: 137 mmol/L (ref 135–145)
Total Bilirubin: 0.8 mg/dL (ref 0.3–1.2)
Total Protein: 5.9 g/dL — ABNORMAL LOW (ref 6.5–8.1)

## 2021-02-28 LAB — CBC
HCT: 24.9 % — ABNORMAL LOW (ref 39.0–52.0)
Hemoglobin: 7.8 g/dL — ABNORMAL LOW (ref 13.0–17.0)
MCH: 28 pg (ref 26.0–34.0)
MCHC: 31.3 g/dL (ref 30.0–36.0)
MCV: 89.2 fL (ref 80.0–100.0)
Platelets: 357 10*3/uL (ref 150–400)
RBC: 2.79 MIL/uL — ABNORMAL LOW (ref 4.22–5.81)
RDW: 16.3 % — ABNORMAL HIGH (ref 11.5–15.5)
WBC: 8.7 10*3/uL (ref 4.0–10.5)
nRBC: 0 % (ref 0.0–0.2)

## 2021-02-28 LAB — MAGNESIUM: Magnesium: 2 mg/dL (ref 1.7–2.4)

## 2021-02-28 NOTE — Progress Notes (Signed)
Pulmonary Critical Care Medicine [facility] [place]   PULMONARY CRITICAL CARE SERVICE  PROGRESS NOTE     Julian Johnson  QQP:[id number]  DOB: [birth year]   DOA: 02-26-2021  Referring Physician: Luna Kitchens, MD  HPI: Julian Johnson is a 80 y.o. male being followed for ventilator/airway/oxygen weaning Acute on Chronic Respiratory Failure.  Patient is comfortable right now without distress at this time has been off of the ventilator on T collar there has not been any improvement as far as the mental status is concerned and this may be baseline for now for this patient  Medications: Reviewed on Rounds  Physical Exam:  Vitals: Temperature is 97.5 pulse 68 respiratory rate 21 blood pressure is 149/75  Ventilator Settings on T collar FiO2 40%  General: Comfortable at this time Neck: supple Cardiovascular: no malignant arrhythmias Respiratory: No rhonchi no rales are noted at this time Skin: no rash seen on limited exam Musculoskeletal: No gross abnormality Psychiatric:unable to assess Neurologic:no involuntary movements         Lab Data:   Basic Metabolic Panel: Recent Labs  Lab 02/21/21 0938 02/24/21 0340 02/25/21 0410 02/26/21 1423 02/27/21 0609 02/28/21 0358  NA 144 142 143 141 139 137  K 4.5 4.7 4.1 4.3 4.5 4.4  CL 111 112* 110 113* 109 106  CO2 25 20* 22 20* 18* 21*  GLUCOSE 163* 105* 143* 114* 158* 143*  BUN 86* 90* 91* 104* 110* 116*  CREATININE 0.93 1.10 1.26* 1.60* 1.58* 1.94*  CALCIUM 8.7* 8.8* 8.9 8.4* 8.3* 8.4*  MG 2.0 1.9 2.0 2.1  --  2.0  PHOS 2.6 2.9 3.1  --  3.5  --     ABG: Recent Labs  Lab 02/25/21 1620  PHART 7.398  PCO2ART 35.9  PO2ART 116*  HCO3 21.7  O2SAT 98.9    Liver Function Tests: Recent Labs  Lab 02/24/21 0340 02/25/21 0410 02/27/21 0609 02/28/21 0358  AST  --   --   --  68*  ALT  --   --   --  63*  ALKPHOS  --   --   --  194*  BILITOT  --   --   --  0.8  PROT  --   --   --  5.9*  ALBUMIN 1.1* 1.0* <1.0*  <1.0*   No results for input(s): LIPASE, AMYLASE in the last 168 hours. No results for input(s): AMMONIA in the last 168 hours.  CBC: Recent Labs  Lab 02/21/21 0938 02/24/21 0340 02/25/21 0410 02/28/21 0358  WBC 11.4* 16.1* 13.8* 8.7  HGB 7.8* 6.7* 8.8* 7.8*  HCT 25.6* 21.3* 28.2* 24.9*  MCV 92.8 93.0 90.4 89.2  PLT 431* 361 418* 357    Cardiac Enzymes: No results for input(s): CKTOTAL, CKMB, CKMBINDEX, TROPONINI in the last 168 hours.  BNP (last 3 results) No results for input(s): BNP in the last 8760 hours.  ProBNP (last 3 results) No results for input(s): PROBNP in the last 8760 hours.  Radiological Exams: US RENAL  Result Date: 02/26/2021 CLINICAL DATA:  Acute renal insufficiency EXAM: RENAL / URINARY TRACT ULTRASOUND COMPLETE COMPARISON:  None. FINDINGS: Right Kidney: Renal measurements: 12 x 6.3 x 6.1 cm = volume: 240.9 mL. Increased cortical echogenicity. Left Kidney: Renal measurements: 10.4 x 6.1 x 5.3 cm = volume: 178 mL. Increased cortical echogenicity. Bladder: Decompressed with a Foley catheter. Other: None. IMPRESSION: 1. Increased cortical echogenicity bilaterally in the kidneys is consistent with medical renal disease. 2. The bladder is poorly  assessed due to decompression with a Foley catheter. Electronically Signed   By: Gerome Sam III M.D.   On: 02/26/2021 17:39   DG CHEST PORT 1 VIEW  Result Date: 02/27/2021 CLINICAL DATA:  Pneumonia and pleural effusions. EXAM: PORTABLE CHEST 1 VIEW COMPARISON:  February 25, 2021. FINDINGS: The Johnson size and mediastinal contours are within normal limits. Tracheostomy tube is unchanged in position. No pneumothorax is noted. Stable bilateral lung opacities are noted concerning for multifocal pneumonia with associated pleural effusions. The visualized skeletal structures are unremarkable. IMPRESSION: Stable support apparatus. Stable bilateral lung opacities and effusions. Electronically Signed   By: Lupita Raider M.D.   On:  02/27/2021 08:40    Assessment/Plan Active Problems:   Acute on chronic respiratory failure with hypoxia (HCC)   Acute ischemic right MCA stroke (HCC)   C5 cervical fracture (HCC)   Healthcare-associated pneumonia   Tracheostomy hemorrhage (HCC)   Acute on chronic respiratory failure hypoxia plan is going to be to continue with the T collar right now.  Suggested a family meeting also to discuss goals of care as far as being able to be completely liberated I did not think that this may have been Acute stroke no change we will continue to follow along closely. C5 cervical fracture has a collar in place Healthcare associated pneumonia treated supportive care Tracheal hemorrhage no bleeding is noted   I have personally seen and evaluated the patient, evaluated laboratory and imaging results, formulated the assessment and plan and placed orders. The Patient requires high complexity decision making with multiple systems involvement.  Rounds were done with the Respiratory Therapy Director and Staff therapists and discussed with nursing staff also.  Yevonne Pax, MD [facility] Pulmonary Critical Care Medicine Sleep Medicine

## 2021-03-01 DIAGNOSIS — J9501 Hemorrhage from tracheostomy stoma: Secondary | ICD-10-CM | POA: Diagnosis not present

## 2021-03-01 DIAGNOSIS — I63511 Cerebral infarction due to unspecified occlusion or stenosis of right middle cerebral artery: Secondary | ICD-10-CM | POA: Diagnosis not present

## 2021-03-01 DIAGNOSIS — J9621 Acute and chronic respiratory failure with hypoxia: Secondary | ICD-10-CM | POA: Diagnosis not present

## 2021-03-01 DIAGNOSIS — J189 Pneumonia, unspecified organism: Secondary | ICD-10-CM | POA: Diagnosis not present

## 2021-03-01 LAB — RENAL FUNCTION PANEL
Albumin: 1 g/dL — ABNORMAL LOW (ref 3.5–5.0)
Anion gap: 11 (ref 5–15)
BUN: 123 mg/dL — ABNORMAL HIGH (ref 8–23)
CO2: 22 mmol/L (ref 22–32)
Calcium: 8.3 mg/dL — ABNORMAL LOW (ref 8.9–10.3)
Chloride: 105 mmol/L (ref 98–111)
Creatinine, Ser: 2.08 mg/dL — ABNORMAL HIGH (ref 0.61–1.24)
GFR, Estimated: 32 mL/min — ABNORMAL LOW (ref >60)
Glucose, Bld: 136 mg/dL — ABNORMAL HIGH (ref 70–99)
Phosphorus: 3.4 mg/dL (ref 2.5–4.6)
Potassium: 4.3 mmol/L (ref 3.5–5.1)
Sodium: 138 mmol/L (ref 135–145)

## 2021-03-01 LAB — CBC
HCT: 27.1 % — ABNORMAL LOW (ref 39.0–52.0)
Hemoglobin: 8.6 g/dL — ABNORMAL LOW (ref 13.0–17.0)
MCH: 27.9 pg (ref 26.0–34.0)
MCHC: 31.7 g/dL (ref 30.0–36.0)
MCV: 88 fL (ref 80.0–100.0)
Platelets: 354 10*3/uL (ref 150–400)
RBC: 3.08 MIL/uL — ABNORMAL LOW (ref 4.22–5.81)
RDW: 16.5 % — ABNORMAL HIGH (ref 11.5–15.5)
WBC: 7.9 10*3/uL (ref 4.0–10.5)
nRBC: 0 % (ref 0.0–0.2)

## 2021-03-01 LAB — MAGNESIUM: Magnesium: 2.2 mg/dL (ref 1.7–2.4)

## 2021-03-01 NOTE — Progress Notes (Signed)
Pulmonary Critical Care Medicine [facility] [place]   PULMONARY CRITICAL CARE SERVICE  PROGRESS NOTE     Tremane Spurgeon  AST:[id number]  DOB: Dec 28, 1940   DOA: February 22, 2021  Referring Physician: Luna Kitchens, MD  HPI: Verdis Koval is a 80 y.o. male being followed for ventilator/airway/oxygen weaning Acute on Chronic Respiratory Failure.  Patient is on T collar turned down to 40% apparently had been turned up to 60% overnight but has excellent saturations  Medications: Reviewed on Rounds  Physical Exam:  Vitals: Temperature is 97.1 pulse 73 respiratory 21 blood pressure is 180/86 saturations are 99%  Ventilator Settings off the ventilator on T collar with an FiO2 of 40%  General: Comfortable at this time Neck: supple Cardiovascular: no malignant arrhythmias Respiratory: Coarse breath sounds are noted no rhonchi Skin: no rash seen on limited exam Musculoskeletal: No gross abnormality Psychiatric:unable to assess Neurologic:no involuntary movements         Lab Data:   Basic Metabolic Panel: Recent Labs  Lab 02/24/21 0340 02/25/21 0410 02/26/21 1423 02/27/21 0609 02/28/21 0358 03/01/21 0340  NA 142 143 141 139 137 138  K 4.7 4.1 4.3 4.5 4.4 4.3  CL 112* 110 113* 109 106 105  CO2 20* 22 20* 18* 21* 22  GLUCOSE 105* 143* 114* 158* 143* 136*  BUN 90* 91* 104* 110* 116* 123*  CREATININE 1.10 1.26* 1.60* 1.58* 1.94* 2.08*  CALCIUM 8.8* 8.9 8.4* 8.3* 8.4* 8.3*  MG 1.9 2.0 2.1  --  2.0 2.2  PHOS 2.9 3.1  --  3.5  --  3.4    ABG: Recent Labs  Lab 02/25/21 1620  PHART 7.398  PCO2ART 35.9  PO2ART 116*  HCO3 21.7  O2SAT 98.9    Liver Function Tests: Recent Labs  Lab 02/24/21 0340 02/25/21 0410 02/27/21 0609 02/28/21 0358 03/01/21 0340  AST  --   --   --  68*  --   ALT  --   --   --  63*  --   ALKPHOS  --   --   --  194*  --   BILITOT  --   --   --  0.8  --   PROT  --   --   --  5.9*  --   ALBUMIN 1.1* 1.0* <1.0* <1.0* 1.0*   No results  for input(s): LIPASE, AMYLASE in the last 168 hours. No results for input(s): AMMONIA in the last 168 hours.  CBC: Recent Labs  Lab 02/24/21 0340 02/25/21 0410 02/28/21 0358 03/01/21 0340  WBC 16.1* 13.8* 8.7 7.9  HGB 6.7* 8.8* 7.8* 8.6*  HCT 21.3* 28.2* 24.9* 27.1*  MCV 93.0 90.4 89.2 88.0  PLT 361 418* 357 354    Cardiac Enzymes: No results for input(s): CKTOTAL, CKMB, CKMBINDEX, TROPONINI in the last 168 hours.  BNP (last 3 results) No results for input(s): BNP in the last 8760 hours.  ProBNP (last 3 results) No results for input(s): PROBNP in the last 8760 hours.  Radiological Exams: No results found.  Assessment/Plan Active Problems:   Acute on chronic respiratory failure with hypoxia (HCC)   Acute ischemic right MCA stroke (HCC)   C5 cervical fracture (HCC)   Healthcare-associated pneumonia   Tracheostomy hemorrhage (HCC)   Acute on chronic respiratory failure with hypoxia the patient's back down to 40% oxygen.  Also of note is that the BUN and creatinine are going up primary care team is aware Acute stroke no change we will continue with supportive  care Healthcare associated pneumonia treated we will continue to monitor closely. Tracheal hemorrhage no further bleeding Elevated renal function primary care team is following along   I have personally seen and evaluated the patient, evaluated laboratory and imaging results, formulated the assessment and plan and placed orders. The Patient requires high complexity decision making with multiple systems involvement.  Rounds were done with the Respiratory Therapy Director and Staff therapists and discussed with nursing staff also.  Yevonne Pax, MD [facility] Pulmonary Critical Care Medicine Sleep Medicine

## 2021-03-02 DIAGNOSIS — J9621 Acute and chronic respiratory failure with hypoxia: Secondary | ICD-10-CM | POA: Diagnosis not present

## 2021-03-02 DIAGNOSIS — I63511 Cerebral infarction due to unspecified occlusion or stenosis of right middle cerebral artery: Secondary | ICD-10-CM | POA: Diagnosis not present

## 2021-03-02 DIAGNOSIS — J9501 Hemorrhage from tracheostomy stoma: Secondary | ICD-10-CM | POA: Diagnosis not present

## 2021-03-02 DIAGNOSIS — J189 Pneumonia, unspecified organism: Secondary | ICD-10-CM | POA: Diagnosis not present

## 2021-03-02 LAB — RENAL FUNCTION PANEL
Albumin: 1.1 g/dL — ABNORMAL LOW (ref 3.5–5.0)
Anion gap: 9 (ref 5–15)
BUN: 125 mg/dL — ABNORMAL HIGH (ref 8–23)
CO2: 25 mmol/L (ref 22–32)
Calcium: 8.4 mg/dL — ABNORMAL LOW (ref 8.9–10.3)
Chloride: 103 mmol/L (ref 98–111)
Creatinine, Ser: 2.28 mg/dL — ABNORMAL HIGH (ref 0.61–1.24)
GFR, Estimated: 28 mL/min — ABNORMAL LOW (ref >60)
Glucose, Bld: 95 mg/dL (ref 70–99)
Phosphorus: 3.7 mg/dL (ref 2.5–4.6)
Potassium: 4.1 mmol/L (ref 3.5–5.1)
Sodium: 137 mmol/L (ref 135–145)

## 2021-03-02 LAB — MAGNESIUM: Magnesium: 2 mg/dL (ref 1.7–2.4)

## 2021-03-02 NOTE — Progress Notes (Signed)
Pulmonary Critical Care Medicine [facility] [place]   PULMONARY CRITICAL CARE SERVICE  PROGRESS NOTE     Julian Johnson  KGY:[id number]  DOB: [birth year]   DOA: 02/17/2021  Referring Physician: Luna Kitchens, MD  HPI: Julian Johnson is a 80 y.o. male being followed for ventilator/airway/oxygen weaning Acute on Chronic Respiratory Failure.  Patient is comfortable right now without distress no fevers are noted.  Has been on T collar was attempted at cuff deflation but apparently did not tolerate  Medications: Reviewed on Rounds  Physical Exam:  Vitals: Temperature is 96.6 pulse 66 respiratory is 21 blood pressure is 156/70 saturations 97%  Ventilator Settings on T collar FiO2 28%  General: Comfortable at this time Neck: supple Cardiovascular: no malignant arrhythmias Respiratory: No rhonchi very coarse breath sounds Skin: no rash seen on limited exam Musculoskeletal: No gross abnormality Psychiatric:unable to assess Neurologic:no involuntary movements         Lab Data:   Basic Metabolic Panel: Recent Labs  Lab 02/24/21 0340 02/25/21 0410 02/26/21 1423 02/27/21 0609 02/28/21 0358 03/01/21 0340 03/02/21 0403  NA 142 143 141 139 137 138 137  K 4.7 4.1 4.3 4.5 4.4 4.3 4.1  CL 112* 110 113* 109 106 105 103  CO2 20* 22 20* 18* 21* 22 25  GLUCOSE 105* 143* 114* 158* 143* 136* 95  BUN 90* 91* 104* 110* 116* 123* 125*  CREATININE 1.10 1.26* 1.60* 1.58* 1.94* 2.08* 2.28*  CALCIUM 8.8* 8.9 8.4* 8.3* 8.4* 8.3* 8.4*  MG 1.9 2.0 2.1  --  2.0 2.2 2.0  PHOS 2.9 3.1  --  3.5  --  3.4 3.7    ABG: Recent Labs  Lab 02/25/21 1620  PHART 7.398  PCO2ART 35.9  PO2ART 116*  HCO3 21.7  O2SAT 98.9    Liver Function Tests: Recent Labs  Lab 02/25/21 0410 02/27/21 0609 02/28/21 0358 03/01/21 0340 03/02/21 0403  AST  --   --  68*  --   --   ALT  --   --  63*  --   --   ALKPHOS  --   --  194*  --   --   BILITOT  --   --  0.8  --   --   PROT  --   --  5.9*  --    --   ALBUMIN 1.0* <1.0* <1.0* 1.0* 1.1*   No results for input(s): LIPASE, AMYLASE in the last 168 hours. No results for input(s): AMMONIA in the last 168 hours.  CBC: Recent Labs  Lab 02/24/21 0340 02/25/21 0410 02/28/21 0358 03/01/21 0340  WBC 16.1* 13.8* 8.7 7.9  HGB 6.7* 8.8* 7.8* 8.6*  HCT 21.3* 28.2* 24.9* 27.1*  MCV 93.0 90.4 89.2 88.0  PLT 361 418* 357 354    Cardiac Enzymes: No results for input(s): CKTOTAL, CKMB, CKMBINDEX, TROPONINI in the last 168 hours.  BNP (last 3 results) No results for input(s): BNP in the last 8760 hours.  ProBNP (last 3 results) No results for input(s): PROBNP in the last 8760 hours.  Radiological Exams: No results found.  Assessment/Plan Active Problems:   Acute on chronic respiratory failure with hypoxia (HCC)   Acute ischemic right MCA stroke (HCC)   C5 cervical fracture (HCC)   Healthcare-associated pneumonia   Tracheostomy hemorrhage (HCC)   Acute on chronic respiratory failure hypoxia patient continues on the T collar was attempted on cuff deflation but did not tolerate.  We will continue to monitor along closely.  Continue with secretion management pulmonary toilet. Tracheal hemorrhage no sign of active bleeding at this time. C5 fracture no change we will continue with supportive care. Acute stroke patient is at baseline Healthcare associated pneumonia treated with antibiotics we will continue to monitor.   I have personally seen and evaluated the patient, evaluated laboratory and imaging results, formulated the assessment and plan and placed orders. The Patient requires high complexity decision making with multiple systems involvement.  Rounds were done with the Respiratory Therapy Director and Staff therapists and discussed with nursing staff also.  Yevonne Pax, MD [facility] Pulmonary Critical Care Medicine Sleep Medicine

## 2021-03-03 DIAGNOSIS — J9621 Acute and chronic respiratory failure with hypoxia: Secondary | ICD-10-CM | POA: Diagnosis not present

## 2021-03-03 DIAGNOSIS — S12401S Unspecified nondisplaced fracture of fifth cervical vertebra, sequela: Secondary | ICD-10-CM | POA: Diagnosis not present

## 2021-03-03 DIAGNOSIS — I63511 Cerebral infarction due to unspecified occlusion or stenosis of right middle cerebral artery: Secondary | ICD-10-CM | POA: Diagnosis not present

## 2021-03-03 DIAGNOSIS — J189 Pneumonia, unspecified organism: Secondary | ICD-10-CM | POA: Diagnosis not present

## 2021-03-03 LAB — RENAL FUNCTION PANEL
Albumin: 1.3 g/dL — ABNORMAL LOW (ref 3.5–5.0)
Anion gap: 9 (ref 5–15)
BUN: 127 mg/dL — ABNORMAL HIGH (ref 8–23)
CO2: 25 mmol/L (ref 22–32)
Calcium: 8.5 mg/dL — ABNORMAL LOW (ref 8.9–10.3)
Chloride: 104 mmol/L (ref 98–111)
Creatinine, Ser: 2.45 mg/dL — ABNORMAL HIGH (ref 0.61–1.24)
GFR, Estimated: 26 mL/min — ABNORMAL LOW (ref >60)
Glucose, Bld: 65 mg/dL — ABNORMAL LOW (ref 70–99)
Phosphorus: 4.2 mg/dL (ref 2.5–4.6)
Potassium: 3.9 mmol/L (ref 3.5–5.1)
Sodium: 138 mmol/L (ref 135–145)

## 2021-03-03 LAB — CBC
HCT: 24 % — ABNORMAL LOW (ref 39.0–52.0)
Hemoglobin: 7.6 g/dL — ABNORMAL LOW (ref 13.0–17.0)
MCH: 27.9 pg (ref 26.0–34.0)
MCHC: 31.7 g/dL (ref 30.0–36.0)
MCV: 88.2 fL (ref 80.0–100.0)
Platelets: 347 10*3/uL (ref 150–400)
RBC: 2.72 MIL/uL — ABNORMAL LOW (ref 4.22–5.81)
RDW: 16.2 % — ABNORMAL HIGH (ref 11.5–15.5)
WBC: 7.8 10*3/uL (ref 4.0–10.5)
nRBC: 0 % (ref 0.0–0.2)

## 2021-03-03 LAB — MAGNESIUM: Magnesium: 2.4 mg/dL (ref 1.7–2.4)

## 2021-03-03 NOTE — Progress Notes (Signed)
Pulmonary Critical Care Medicine [facility] [place]   PULMONARY CRITICAL CARE SERVICE  PROGRESS NOTE     Hector Taft  HUT:[id number]  DOB: 07/09/1940   DOA: 02/11/2021  Referring Physician: Luna Kitchens, MD  HPI: Yehia Mcbain is a 80 y.o. male being followed for ventilator/airway/oxygen weaning Acute on Chronic Respiratory Failure.  Patient is resting comfortably right now on T collar at baseline.  Secretions are fair to moderate  Medications: Reviewed on Rounds  Physical Exam:  Vitals: Temperature is 97.7 pulse 75 respiratory is 26 blood pressure is 158/82 saturations 95%  Ventilator Settings off ventilator on T collar FiO2 28%  General: Comfortable at this time Neck: supple Cardiovascular: no malignant arrhythmias Respiratory: Scattered rhonchi expansion is equal at this time. Skin: no rash seen on limited exam Musculoskeletal: No gross abnormality Psychiatric:unable to assess Neurologic:no involuntary movements         Lab Data:   Basic Metabolic Panel: Recent Labs  Lab 02/25/21 0410 02/26/21 1423 02/27/21 0609 02/28/21 0358 03/01/21 0340 03/02/21 0403 03/03/21 0448  NA 143 141 139 137 138 137 138  K 4.1 4.3 4.5 4.4 4.3 4.1 3.9  CL 110 113* 109 106 105 103 104  CO2 22 20* 18* 21* 22 25 25   GLUCOSE 143* 114* 158* 143* 136* 95 65*  BUN 91* 104* 110* 116* 123* 125* 127*  CREATININE 1.26* 1.60* 1.58* 1.94* 2.08* 2.28* 2.45*  CALCIUM 8.9 8.4* 8.3* 8.4* 8.3* 8.4* 8.5*  MG 2.0 2.1  --  2.0 2.2 2.0 2.4  PHOS 3.1  --  3.5  --  3.4 3.7 4.2    ABG: Recent Labs  Lab 02/25/21 1620  PHART 7.398  PCO2ART 35.9  PO2ART 116*  HCO3 21.7  O2SAT 98.9    Liver Function Tests: Recent Labs  Lab 02/27/21 0609 02/28/21 0358 03/01/21 0340 03/02/21 0403 03/03/21 0448  AST  --  68*  --   --   --   ALT  --  63*  --   --   --   ALKPHOS  --  194*  --   --   --   BILITOT  --  0.8  --   --   --   PROT  --  5.9*  --   --   --   ALBUMIN <1.0* <1.0*  1.0* 1.1* 1.3*   No results for input(s): LIPASE, AMYLASE in the last 168 hours. No results for input(s): AMMONIA in the last 168 hours.  CBC: Recent Labs  Lab 02/25/21 0410 02/28/21 0358 03/01/21 0340 03/03/21 0448  WBC 13.8* 8.7 7.9 7.8  HGB 8.8* 7.8* 8.6* 7.6*  HCT 28.2* 24.9* 27.1* 24.0*  MCV 90.4 89.2 88.0 88.2  PLT 418* 357 354 347    Cardiac Enzymes: No results for input(s): CKTOTAL, CKMB, CKMBINDEX, TROPONINI in the last 168 hours.  BNP (last 3 results) No results for input(s): BNP in the last 8760 hours.  ProBNP (last 3 results) No results for input(s): PROBNP in the last 8760 hours.  Radiological Exams: No results found.  Assessment/Plan Active Problems:   Acute on chronic respiratory failure with hypoxia (HCC)   Acute ischemic right MCA stroke (HCC)   C5 cervical fracture (HCC)   Healthcare-associated pneumonia   Tracheostomy hemorrhage (HCC)   Acute on chronic respiratory failure with hypoxia we will continue with T collar at baseline.  Titrate oxygen as necessary we will continue pulmonary toilet supportive care. Acute stroke no change we will continue to follow  along C5 fracture patient is at baseline Healthcare associated pneumonia treated slow improvement we will continue to monitor closely Tracheal hemorrhage no sign of active bleeding at this time.   I have personally seen and evaluated the patient, evaluated laboratory and imaging results, formulated the assessment and plan and placed orders. The Patient requires high complexity decision making with multiple systems involvement.  Rounds were done with the Respiratory Therapy Director and Staff therapists and discussed with nursing staff also.  Yevonne Pax, MD [facility] Pulmonary Critical Care Medicine Sleep Medicine

## 2021-03-03 DEATH — deceased

## 2021-03-04 LAB — CBC
HCT: 24.5 % — ABNORMAL LOW (ref 39.0–52.0)
Hemoglobin: 7.9 g/dL — ABNORMAL LOW (ref 13.0–17.0)
MCH: 28.6 pg (ref 26.0–34.0)
MCHC: 32.2 g/dL (ref 30.0–36.0)
MCV: 88.8 fL (ref 80.0–100.0)
Platelets: 349 10*3/uL (ref 150–400)
RBC: 2.76 MIL/uL — ABNORMAL LOW (ref 4.22–5.81)
RDW: 16.3 % — ABNORMAL HIGH (ref 11.5–15.5)
WBC: 7.7 10*3/uL (ref 4.0–10.5)
nRBC: 0 % (ref 0.0–0.2)

## 2021-03-04 LAB — RENAL FUNCTION PANEL
Albumin: 1.2 g/dL — ABNORMAL LOW (ref 3.5–5.0)
Anion gap: 9 (ref 5–15)
BUN: 128 mg/dL — ABNORMAL HIGH (ref 8–23)
CO2: 24 mmol/L (ref 22–32)
Calcium: 8.3 mg/dL — ABNORMAL LOW (ref 8.9–10.3)
Chloride: 103 mmol/L (ref 98–111)
Creatinine, Ser: 2.67 mg/dL — ABNORMAL HIGH (ref 0.61–1.24)
GFR, Estimated: 23 mL/min — ABNORMAL LOW (ref >60)
Glucose, Bld: 67 mg/dL — ABNORMAL LOW (ref 70–99)
Phosphorus: 4.7 mg/dL — ABNORMAL HIGH (ref 2.5–4.6)
Potassium: 3.9 mmol/L (ref 3.5–5.1)
Sodium: 136 mmol/L (ref 135–145)

## 2021-03-04 LAB — MAGNESIUM: Magnesium: 2.4 mg/dL (ref 1.7–2.4)

## 2021-03-05 ENCOUNTER — Other Ambulatory Visit (HOSPITAL_COMMUNITY): Payer: No Typology Code available for payment source

## 2021-03-05 LAB — CBC
HCT: 24.5 % — ABNORMAL LOW (ref 39.0–52.0)
Hemoglobin: 7.7 g/dL — ABNORMAL LOW (ref 13.0–17.0)
MCH: 28.1 pg (ref 26.0–34.0)
MCHC: 31.4 g/dL (ref 30.0–36.0)
MCV: 89.4 fL (ref 80.0–100.0)
Platelets: 343 10*3/uL (ref 150–400)
RBC: 2.74 MIL/uL — ABNORMAL LOW (ref 4.22–5.81)
RDW: 16.5 % — ABNORMAL HIGH (ref 11.5–15.5)
WBC: 6.9 10*3/uL (ref 4.0–10.5)
nRBC: 0 % (ref 0.0–0.2)

## 2021-03-05 LAB — RENAL FUNCTION PANEL
Albumin: 1.2 g/dL — ABNORMAL LOW (ref 3.5–5.0)
Anion gap: 9 (ref 5–15)
BUN: 126 mg/dL — ABNORMAL HIGH (ref 8–23)
CO2: 25 mmol/L (ref 22–32)
Calcium: 8.4 mg/dL — ABNORMAL LOW (ref 8.9–10.3)
Chloride: 103 mmol/L (ref 98–111)
Creatinine, Ser: 2.78 mg/dL — ABNORMAL HIGH (ref 0.61–1.24)
GFR, Estimated: 22 mL/min — ABNORMAL LOW (ref >60)
Glucose, Bld: 144 mg/dL — ABNORMAL HIGH (ref 70–99)
Phosphorus: 4.8 mg/dL — ABNORMAL HIGH (ref 2.5–4.6)
Potassium: 4.2 mmol/L (ref 3.5–5.1)
Sodium: 137 mmol/L (ref 135–145)

## 2021-03-05 LAB — MAGNESIUM: Magnesium: 2.5 mg/dL — ABNORMAL HIGH (ref 1.7–2.4)

## 2021-03-06 ENCOUNTER — Other Ambulatory Visit (HOSPITAL_COMMUNITY): Payer: No Typology Code available for payment source

## 2021-04-02 DEATH — deceased

## 2023-01-10 IMAGING — DX DG CHEST 1V PORT
1 series · 1 of 1 positions shown · non-contrast
Comparison: None.

CLINICAL DATA: Check central line placement

EXAM:
PORTABLE CHEST 1 VIEW

[chest]
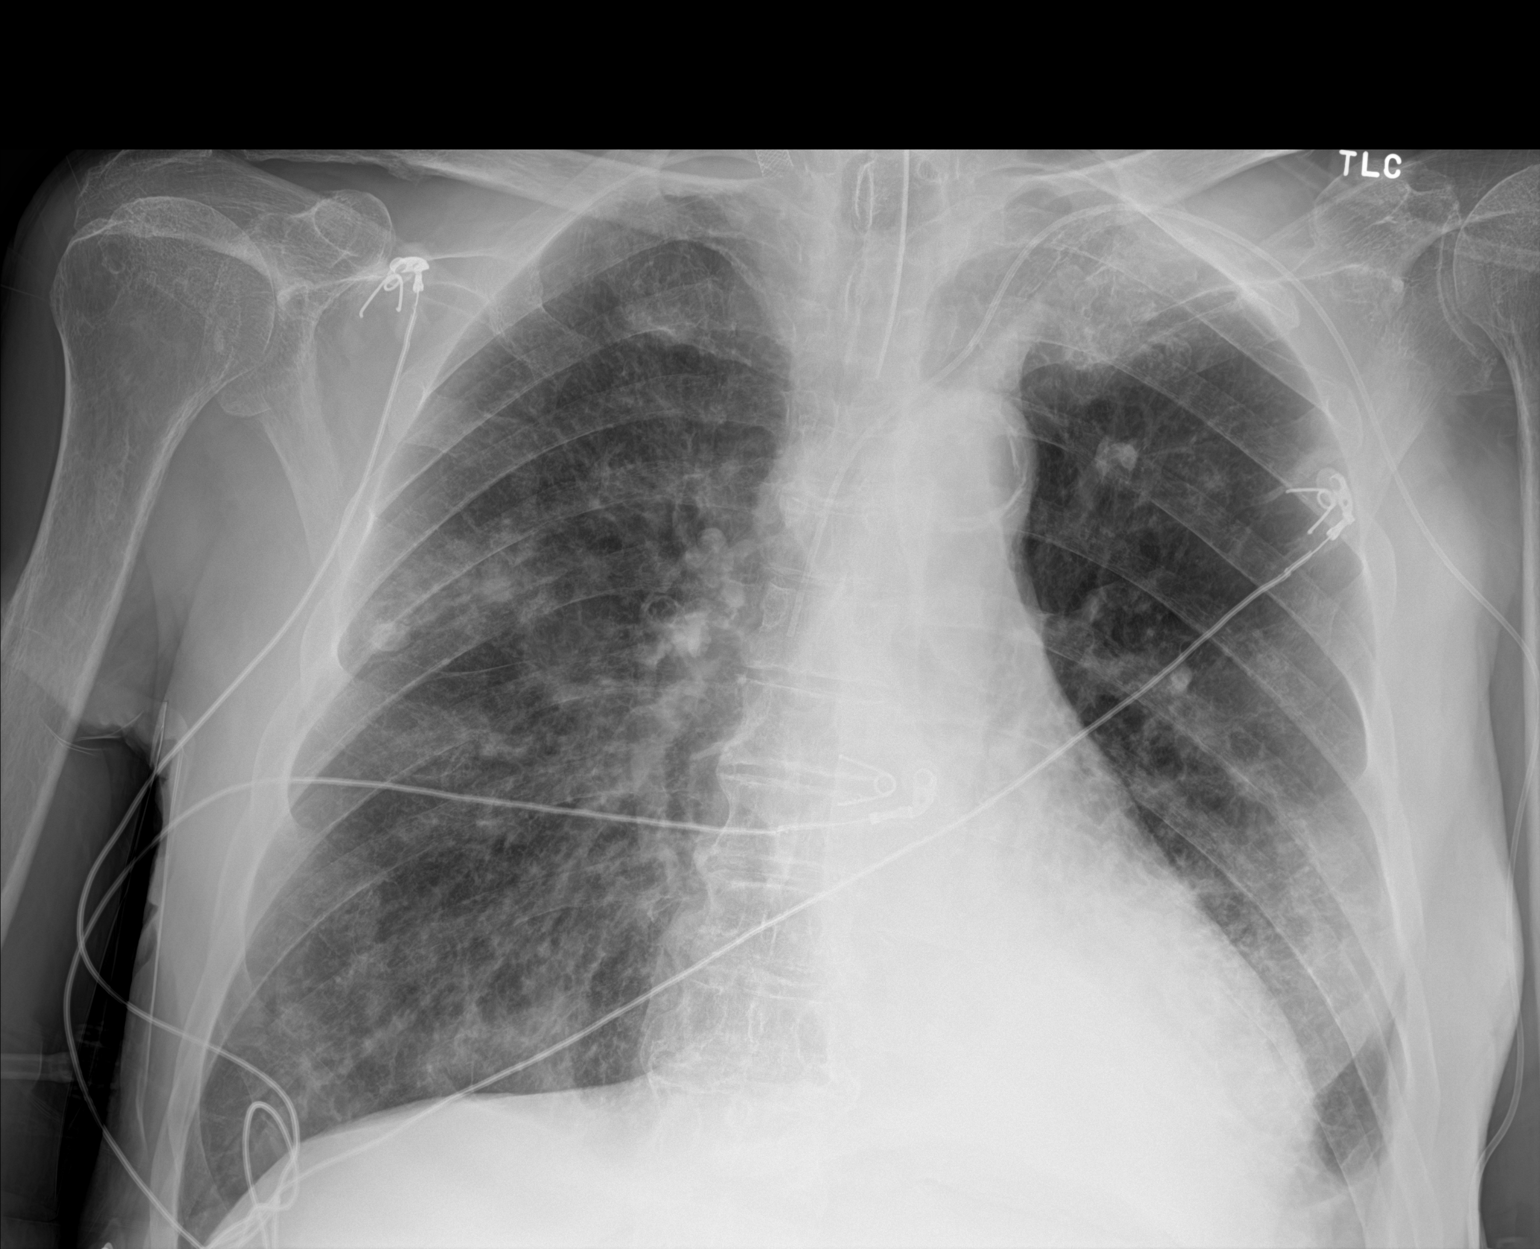

[1 of 1 positions shown; findings below may reference images not displayed]

FINDINGS: Cardiac shadow is within normal limits. Endotracheal tube is noted
in satisfactory position. Left-sided PICC line is noted in the mid
to distal SVC. Right carotid stent is seen. Lungs are well aerated
bilaterally with patchy airspace opacity worse in the left
retrocardiac region consistent with multifocal pneumonia.
IMPRESSION: Patchy multifocal pneumonia worst in the left retrocardiac region.

Tubes and lines in satisfactory position.

## 2023-01-10 IMAGING — DX DG ABDOMEN 1V
1 series · 1 of 1 positions shown · non-contrast
Comparison: None.

CLINICAL DATA: Check gastrostomy catheter placement

EXAM:
ABDOMEN - 1 VIEW

[abdomen]
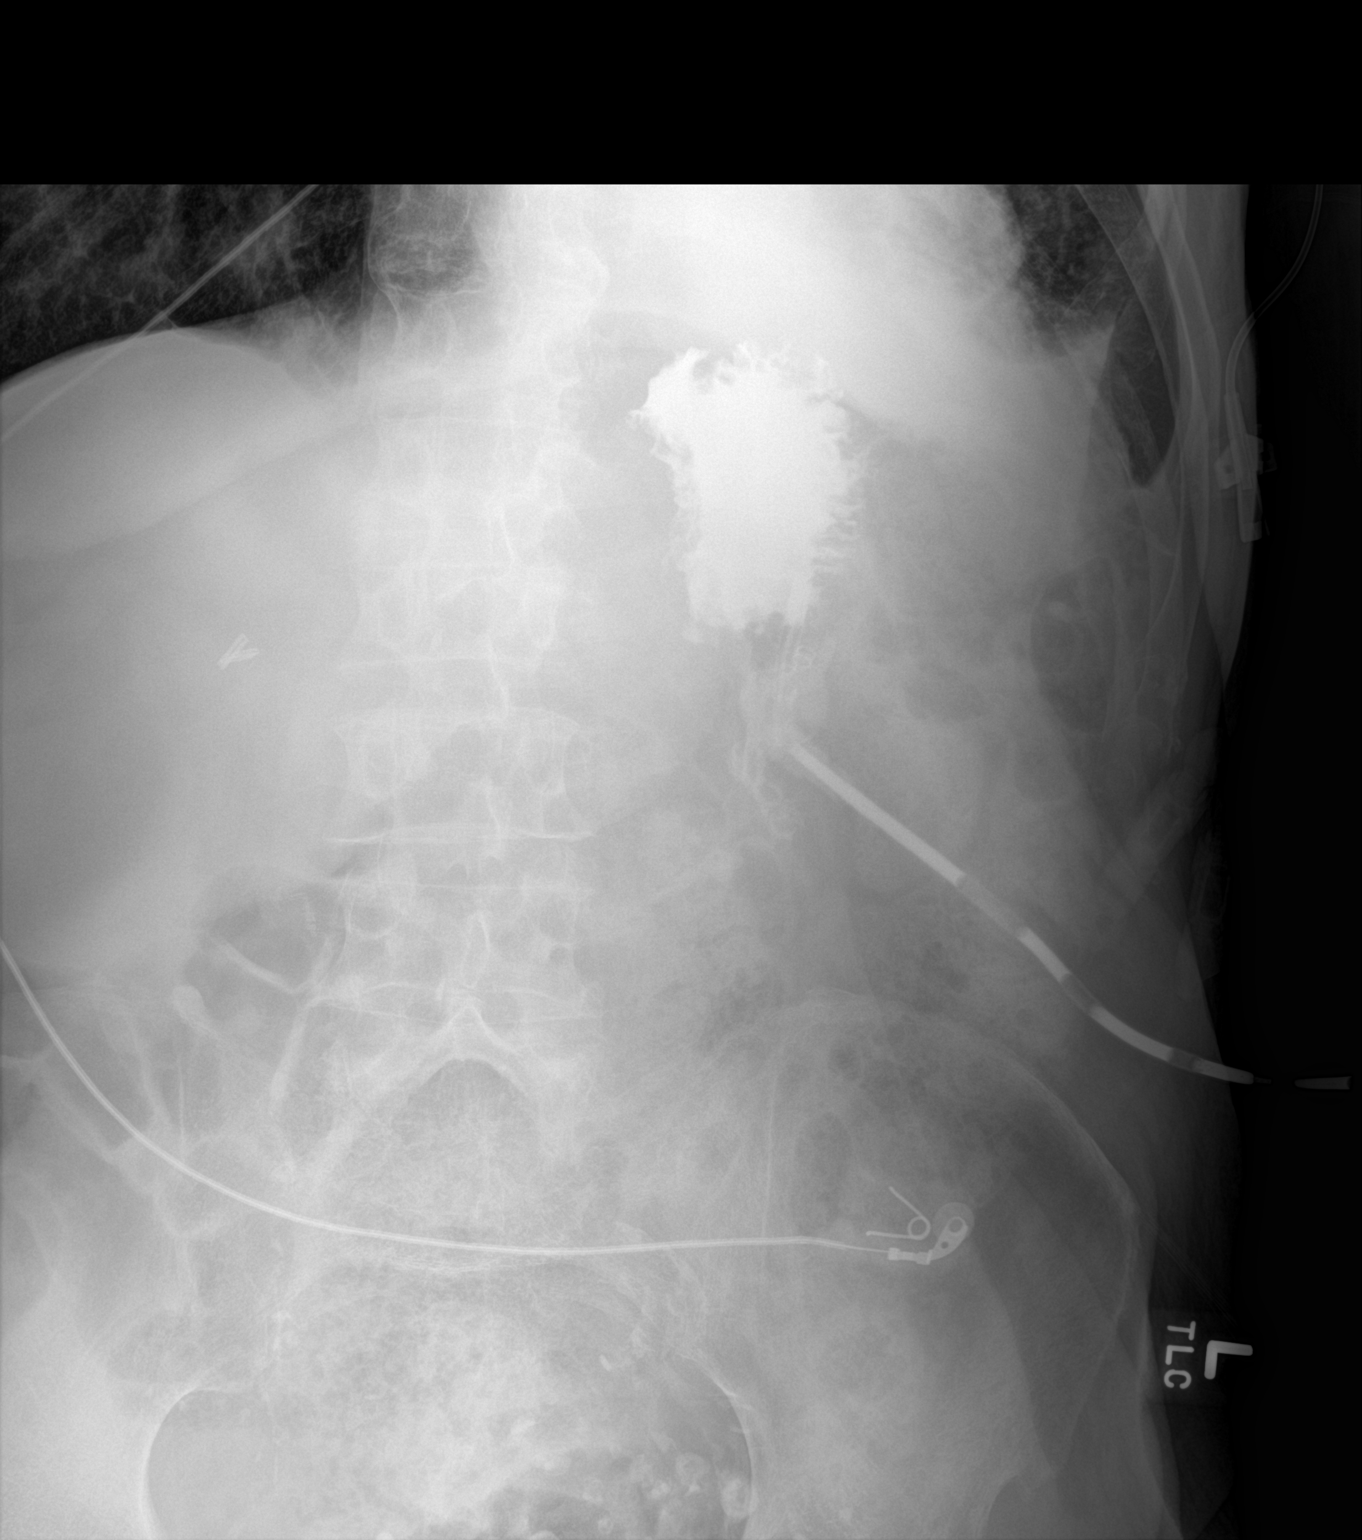

[1 of 1 positions shown; findings below may reference images not displayed]

FINDINGS: Gastrostomy catheter is noted injected with contrast. Contrast flows
directly into the gastric lumen. No obstructive changes are seen.
IMPRESSION: Gastrostomy catheter within the stomach.

## 2023-01-29 IMAGING — DX DG CHEST 1V PORT
1 series · 1 of 1 positions shown · non-contrast
Comparison: Five days ago

CLINICAL DATA: Evaluate multi lobar pneumonia

EXAM:
PORTABLE CHEST 1 VIEW

[chest ap]
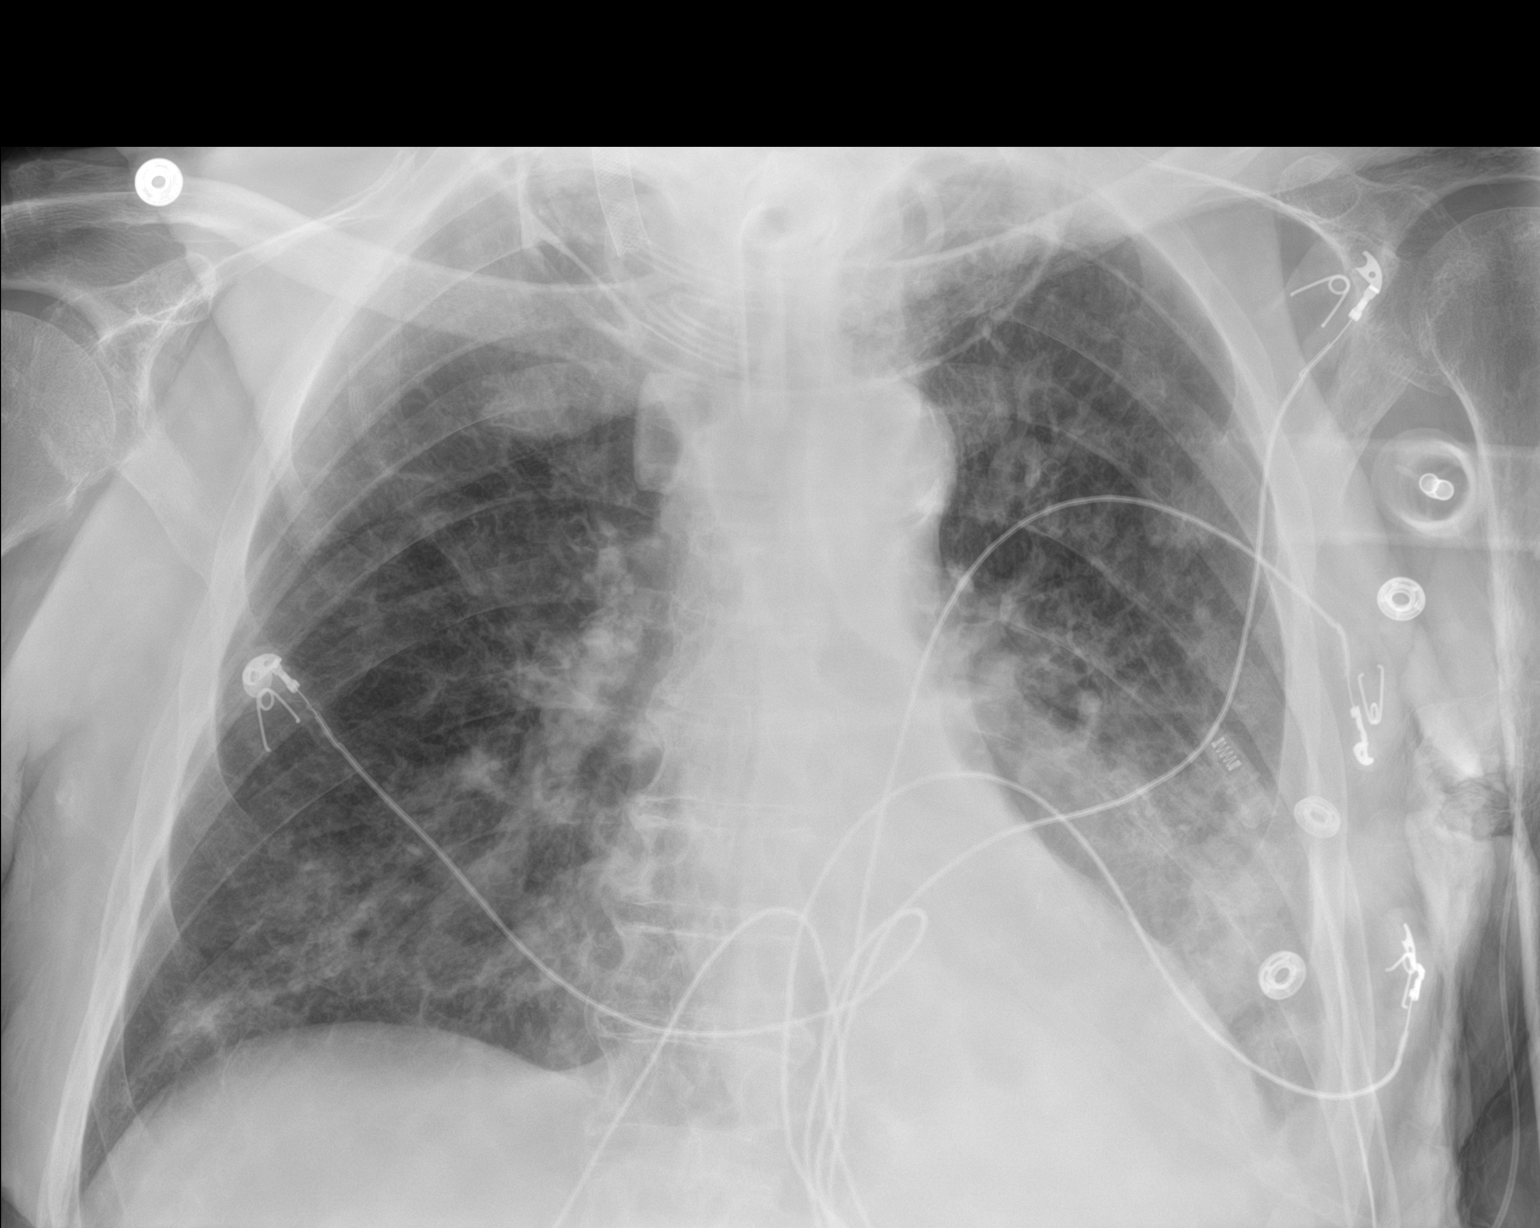

[1 of 1 positions shown; findings below may reference images not displayed]

FINDINGS: Tracheostomy tube in place. Right stent presumably in the carotid.
Infiltrates at the left more than right base which is similar are
slightly progressed on the right. Stable heart size which is not
enlarged. No visible pneumothorax or cavitation. Small left pleural
effusion.
IMPRESSION: Stable to mildly progressed bilateral pneumonia.

## 2023-02-02 IMAGING — DX DG CHEST 1V PORT
1 series · 1 of 1 positions shown · non-contrast
Comparison: 02/09/2021

CLINICAL DATA: Respiratory failure

EXAM:
PORTABLE CHEST 1 VIEW

[chest ap]
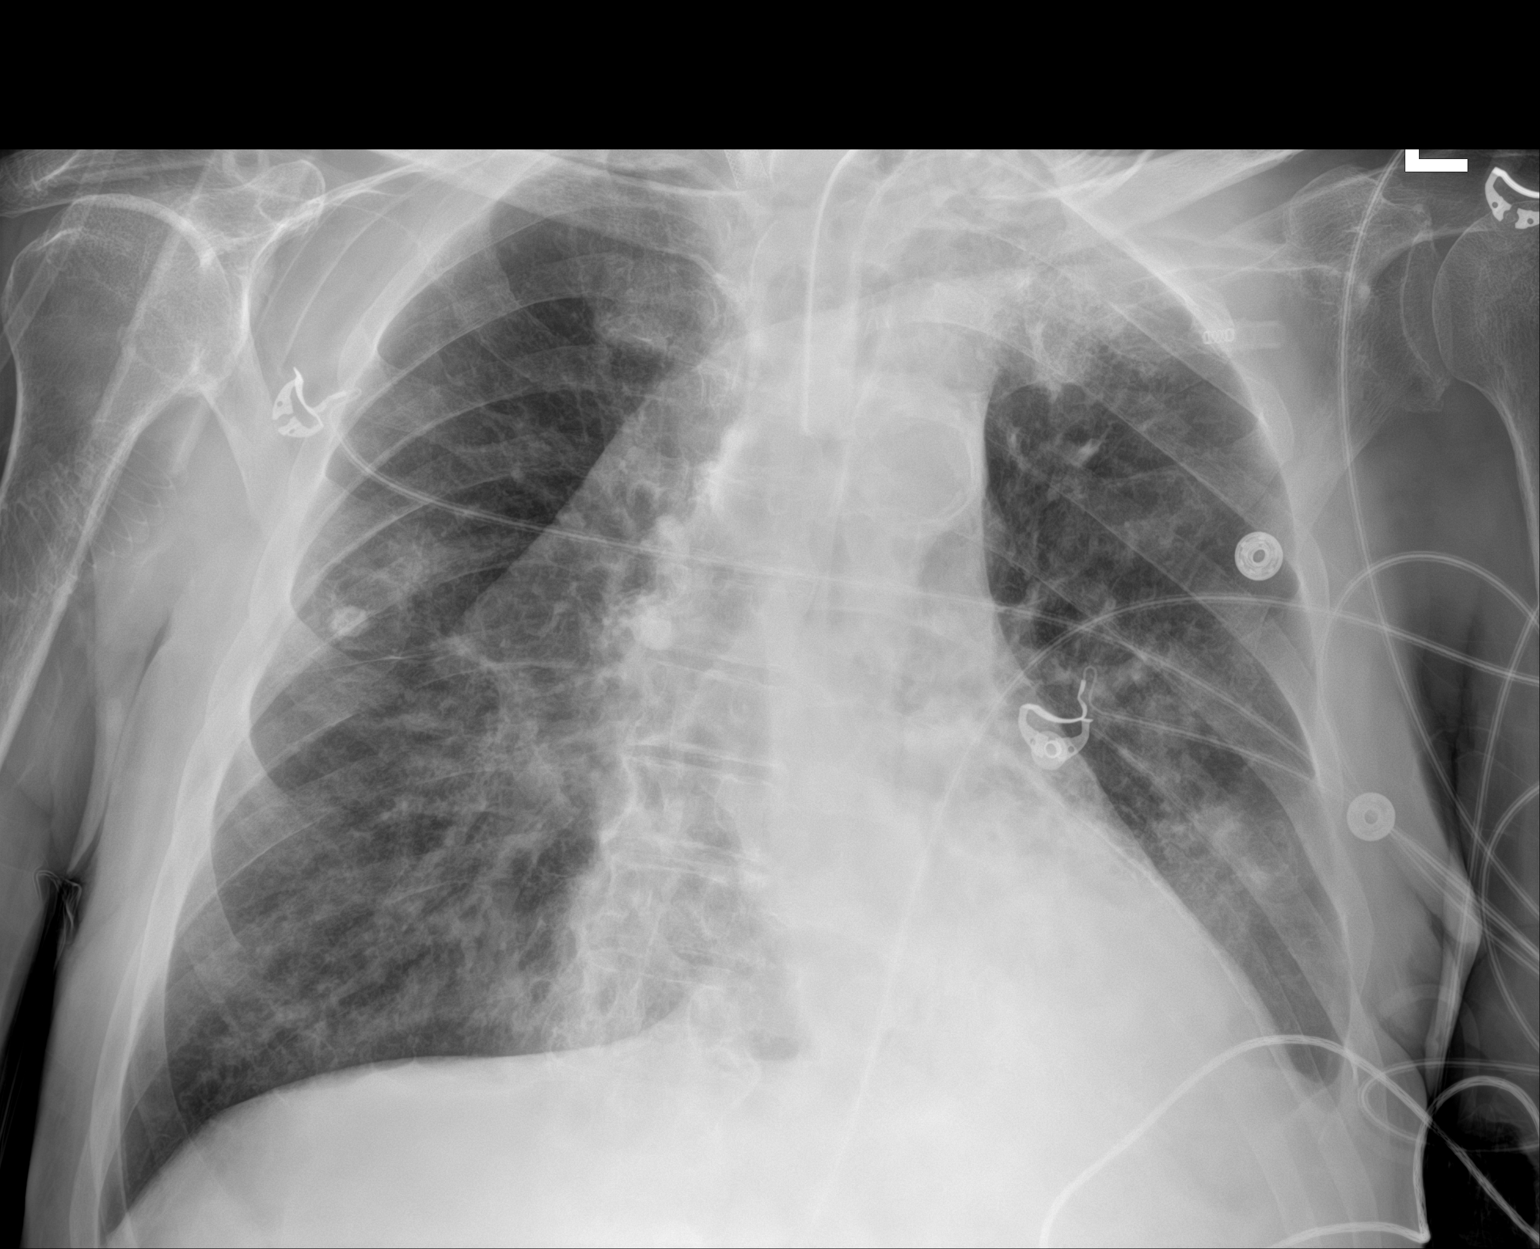

[1 of 1 positions shown; findings below may reference images not displayed]

FINDINGS: Tracheostomy remains in good position. Left lower lobe consolidation
and small left effusion unchanged.

COPD. Right lung remains clear. Calcified granuloma right upper
lobe.
IMPRESSION: Left lower lobe consolidation and small left effusion unchanged.

## 2023-02-06 IMAGING — DX DG CHEST 1V PORT
1 series · 1 of 1 positions shown · non-contrast
Comparison: Chest x-ray dated February 10, 2021.

CLINICAL DATA: Pneumonia.

EXAM:
PORTABLE CHEST 1 VIEW

[chest]
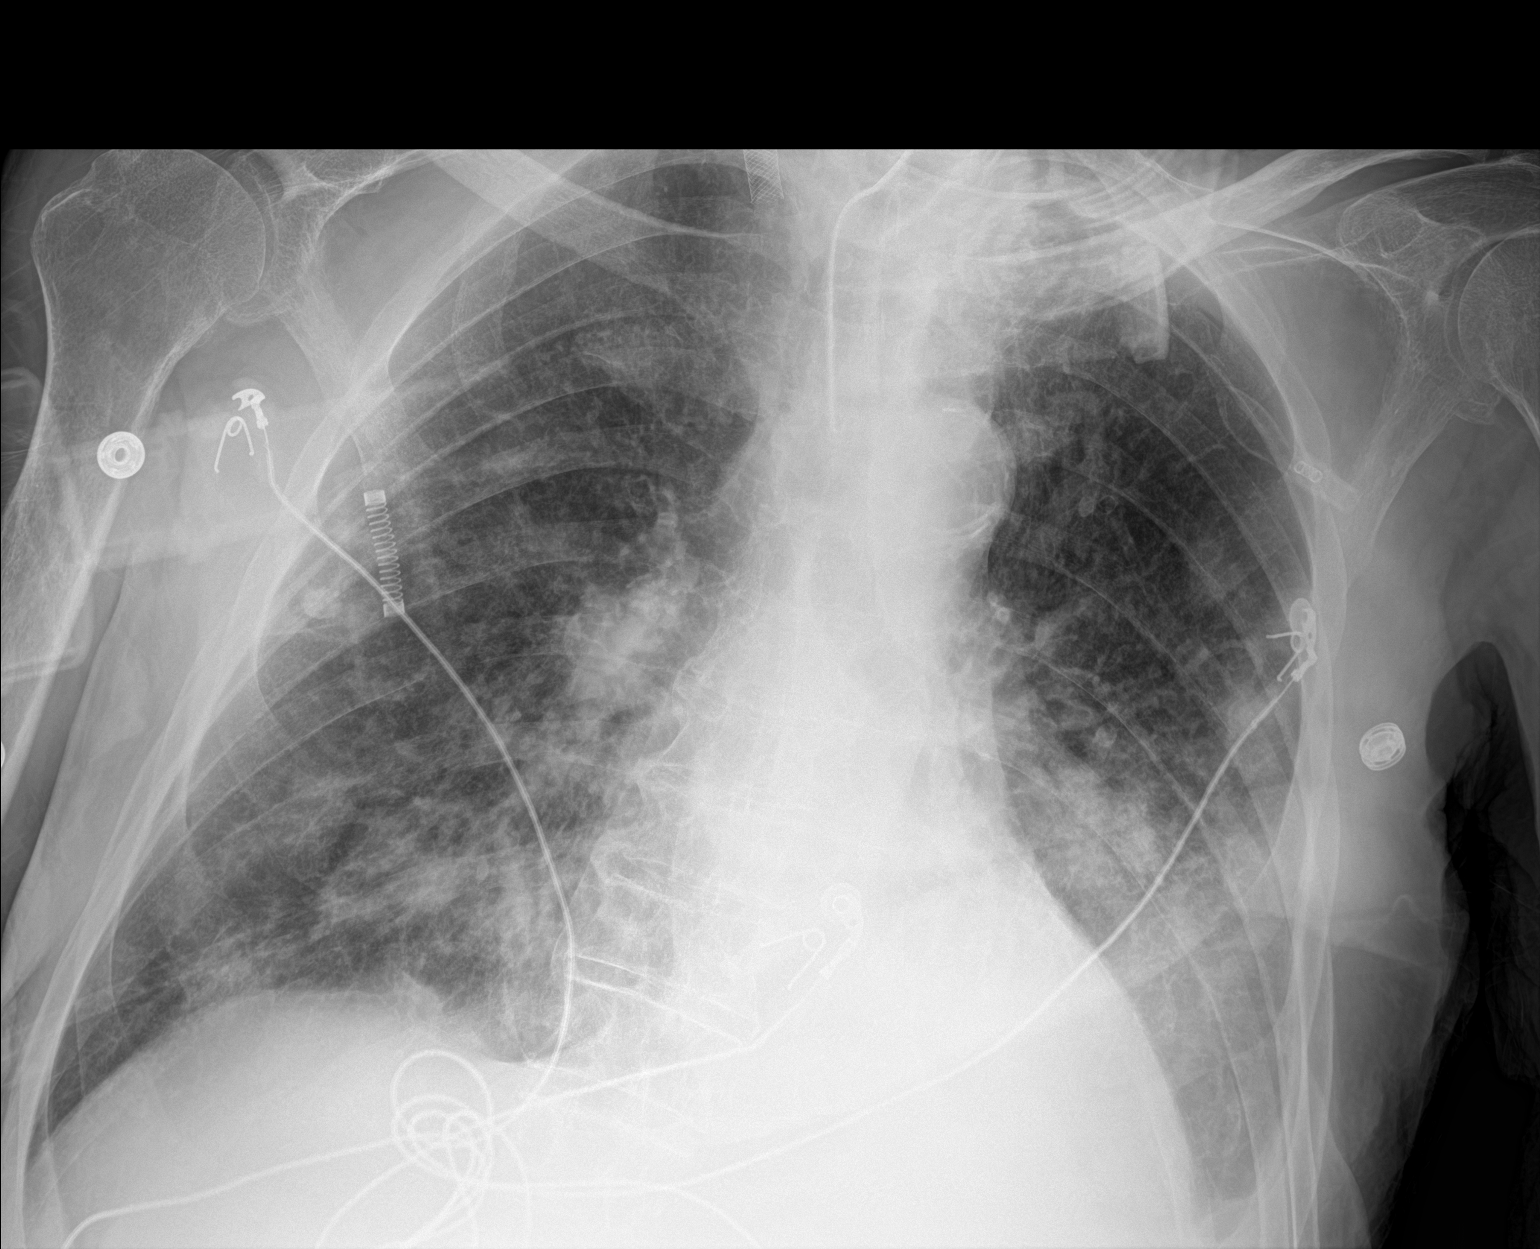

[1 of 1 positions shown; findings below may reference images not displayed]

FINDINGS: Unchanged tracheostomy tube. Stable cardiomediastinal silhouette.
Similar to slightly worsening left lower lobe consolidation with
unchanged small left pleural effusion. Increased patchy opacities at
the right lung base. No pneumothorax. No acute osseous abnormality.
IMPRESSION: 1. Similar to slightly worsening left lower lobe pneumonia with
unchanged small left pleural effusion.
2. New right basilar pneumonia.
